# Patient Record
Sex: Female | Born: 1995 | Hispanic: Yes | Smoking: Never smoker
Health system: Southern US, Community
[De-identification: ages and names within clinical notes are randomized; demographics above are authoritative.]

## PROBLEM LIST (undated history)

## (undated) DIAGNOSIS — Z789 Other specified health status: Secondary | ICD-10-CM

## (undated) HISTORY — PX: NO PAST SURGERIES: SHX2092

## (undated) HISTORY — DX: Other specified health status: Z78.9

---

## 2018-05-29 ENCOUNTER — Inpatient Hospital Stay (HOSPITAL_COMMUNITY)
Admission: AD | Admit: 2018-05-29 | Discharge: 2018-05-29 | Disposition: A | Discharge status: Home / Self Care | Payer: Medicaid - Out of State | Attending: Obstetrics & Gynecology | Admitting: Obstetrics & Gynecology

## 2018-05-29 ENCOUNTER — Other Ambulatory Visit: Payer: Self-pay

## 2018-05-29 ENCOUNTER — Encounter (HOSPITAL_COMMUNITY): Payer: Self-pay

## 2018-05-29 DIAGNOSIS — O26892 Other specified pregnancy related conditions, second trimester: Secondary | ICD-10-CM | POA: Diagnosis not present

## 2018-05-29 DIAGNOSIS — Z3A24 24 weeks gestation of pregnancy: Secondary | ICD-10-CM | POA: Insufficient documentation

## 2018-05-29 DIAGNOSIS — R1031 Right lower quadrant pain: Secondary | ICD-10-CM | POA: Insufficient documentation

## 2018-05-29 DIAGNOSIS — O26899 Other specified pregnancy related conditions, unspecified trimester: Secondary | ICD-10-CM

## 2018-05-29 DIAGNOSIS — R102 Pelvic and perineal pain: Secondary | ICD-10-CM

## 2018-05-29 LAB — URINALYSIS, ROUTINE W REFLEX MICROSCOPIC
Bilirubin Urine: NEGATIVE
Glucose, UA: NEGATIVE mg/dL
Hgb urine dipstick: NEGATIVE
Ketones, ur: NEGATIVE mg/dL
Nitrite: NEGATIVE
Protein, ur: NEGATIVE mg/dL
Specific Gravity, Urine: 1.017 (ref 1.005–1.030)
pH: 7 (ref 5.0–8.0)

## 2018-05-29 LAB — POCT PREGNANCY, URINE: Preg Test, Ur: POSITIVE — AB

## 2018-05-29 LAB — WET PREP, GENITAL
Sperm: NONE SEEN
Trich, Wet Prep: NONE SEEN
Yeast Wet Prep HPF POC: NONE SEEN

## 2018-05-29 NOTE — MAU Note (Signed)
Cynthia Smith is a 23 y.o. at [redacted]w[redacted]d here in MAU reporting: intermittent right sided abdominal pain for the past week. No vaginal bleeding, reports normal vaginal discharge, no LOF. + [id number]  Onset of complaint: ongoing for a week  Pain score: 6/10  Vitals:   05/29/18 1558  BP: (!) 105/55  Pulse: 90  Resp: 18  Temp: 99 F (37.2 C)  SpO2: 98%      FHT:+ FM  Lab orders placed from triage: UA, UPT (unaware pt was [redacted] weeks pregnant)

## 2018-05-29 NOTE — Discharge Instructions (Signed)

## 2018-05-29 NOTE — MAU Provider Note (Signed)
History     CSN: [id number]  Arrival date and time: 05/29/18 1530   First Provider Initiated Contact with Patient 05/29/18 1612      Chief Complaint  Patient presents with  . Abdominal Pain   23 y.o. G1 @24 .5 wks presenting with RLQ pain. Pain started 4 days ago. Pain is intermittent and sharp. Pain lasts for short periods of time. Rates 6/10. Pain is relieved by Tylenol. Denies urinary sx. No GI sx. No fevers. No VB, LOF, or ctx. +FM. She was getting care in [place], last visit in late March. Denies heavy lifting but reports recent initiation of doing side planks for exercise.    OB History    Gravida  1   Para      Term      Preterm      AB      Living        SAB      TAB      Ectopic      Multiple      Live Births              History reviewed. No pertinent past medical history.  History reviewed. No pertinent surgical history.  History reviewed. No pertinent family history.  Social History   Tobacco Use  . Smoking status: Never Smoker  . Smokeless tobacco: Never Used  Substance Use Topics  . Alcohol use: Never    Frequency: Never  . Drug use: Never    Allergies: No Known Allergies  No medications prior to admission.    Review of Systems  Constitutional: Negative for fever.  Gastrointestinal: Positive for abdominal pain. Negative for constipation, diarrhea, nausea and vomiting.  Genitourinary: Negative for dysuria, frequency, hematuria, vaginal bleeding and vaginal discharge.  Musculoskeletal: Positive for back pain.   Physical Exam   Blood pressure 113/68, pulse 91, temperature 99 F (37.2 C), temperature source Oral, resp. rate 18, last menstrual period 12/07/2017, SpO2 98 %.  Physical Exam  Nursing note and vitals reviewed. Constitutional: She is oriented to person, place, and time. She appears well-developed and well-nourished. No distress.  HENT:  Head: Normocephalic and atraumatic.  Neck: Normal range of motion.   Cardiovascular: Normal rate.  Respiratory: Effort normal. No respiratory distress.  GI: Soft. She exhibits no distension and no mass. There is no abdominal tenderness. There is no rebound, no guarding and no CVA tenderness.  gravid  Genitourinary:    Genitourinary Comments: SVE closed/thick   Musculoskeletal: Normal range of motion.     Cervical back: Normal.     Thoracic back: Normal.     Lumbar back: Normal.  Neurological: She is alert and oriented to person, place, and time.  Skin: Skin is warm and dry.  Psychiatric: She has a normal mood and affect.  EFM: 135 bpm, mod variability, + accels, no decels Toco: irritability  Results for orders placed or performed during the hospital encounter of 05/29/18 (from the past 24 hour(s))  Urinalysis, Routine w reflex microscopic     Status: Abnormal   Collection Time: 05/29/18  3:48 PM  Result Value Ref Range   Color, Urine YELLOW YELLOW   APPearance CLOUDY (A) CLEAR   Specific Gravity, Urine 1.017 1.005 - 1.030   pH 7.0 5.0 - 8.0   Glucose, UA NEGATIVE NEGATIVE mg/dL   Hgb urine dipstick NEGATIVE NEGATIVE   Bilirubin Urine NEGATIVE NEGATIVE   Ketones, ur NEGATIVE NEGATIVE mg/dL   Protein, ur NEGATIVE NEGATIVE mg/dL  Nitrite NEGATIVE NEGATIVE   Leukocytes,Ua LARGE (A) NEGATIVE   RBC / HPF 0-5 0 - 5 RBC/hpf   WBC, UA 21-50 0 - 5 WBC/hpf   Bacteria, UA RARE (A) NONE SEEN   Squamous Epithelial / LPF 21-50 0 - 5   Mucus PRESENT   Pregnancy, urine POC     Status: Abnormal   Collection Time: 05/29/18  3:52 PM  Result Value Ref Range   Preg Test, Ur POSITIVE (A) NEGATIVE  Wet prep, genital     Status: Abnormal   Collection Time: 05/29/18  4:27 PM  Result Value Ref Range   Yeast Wet Prep HPF POC NONE SEEN NONE SEEN   Trich, Wet Prep NONE SEEN NONE SEEN   Clue Cells Wet Prep HPF POC PRESENT (A) NONE SEEN   WBC, Wet Prep HPF POC MANY (A) NONE SEEN   Sperm NONE SEEN    MAU Course  Procedures Orders Placed This Encounter   Procedures  . Wet prep, genital    Standing Status:   Standing    Number of Occurrences:   1    Order Specific Question:   Patient immune status    Answer:   Normal  . Urinalysis, Routine w reflex microscopic    Standing Status:   Standing    Number of Occurrences:   1  . Pregnancy, urine POC    Standing Status:   Standing    Number of Occurrences:   1  . Discharge patient    Order Specific Question:   Discharge disposition    Answer:   01-Home or Self Care [1]    Order Specific Question:   Discharge patient date    Answer:   05/29/2018   MDM Labs ordered and reviewed. No evidence of PTL or UTI. Pain likely MSK and/or RL. Discussed comfort measures. Stable for discharge home.   Assessment and Plan   1. [redacted] weeks gestation of pregnancy   2. Pain of round ligament during pregnancy    Discharge home Follow up at [facility] to start care asap PTL precautions  Allergies as of 05/29/2018   No Known Allergies     Medication List    You have not been prescribed any medications.    Donette Larry, CNM 05/29/2018, 5:00 PM

## 2018-05-31 LAB — GC/CHLAMYDIA PROBE AMP (~~LOC~~) NOT AT ARMC
Chlamydia: NEGATIVE
Neisseria Gonorrhea: NEGATIVE

## 2018-08-03 ENCOUNTER — Encounter (HOSPITAL_COMMUNITY): Payer: Self-pay

## 2018-08-03 ENCOUNTER — Other Ambulatory Visit: Payer: Self-pay

## 2018-08-03 ENCOUNTER — Inpatient Hospital Stay (HOSPITAL_COMMUNITY)
Admission: AD | Admit: 2018-08-03 | Discharge: 2018-08-03 | Disposition: A | Discharge status: Home / Self Care | Payer: Medicaid - Out of State | Attending: Obstetrics and Gynecology | Admitting: Obstetrics and Gynecology

## 2018-08-03 ENCOUNTER — Inpatient Hospital Stay (HOSPITAL_BASED_OUTPATIENT_CLINIC_OR_DEPARTMENT_OTHER): Payer: Medicaid - Out of State

## 2018-08-03 DIAGNOSIS — B9689 Other specified bacterial agents as the cause of diseases classified elsewhere: Secondary | ICD-10-CM | POA: Insufficient documentation

## 2018-08-03 DIAGNOSIS — O4693 Antepartum hemorrhage, unspecified, third trimester: Secondary | ICD-10-CM | POA: Diagnosis not present

## 2018-08-03 DIAGNOSIS — D649 Anemia, unspecified: Secondary | ICD-10-CM | POA: Insufficient documentation

## 2018-08-03 DIAGNOSIS — O98813 Other maternal infectious and parasitic diseases complicating pregnancy, third trimester: Secondary | ICD-10-CM

## 2018-08-03 DIAGNOSIS — O23593 Infection of other part of genital tract in pregnancy, third trimester: Secondary | ICD-10-CM | POA: Insufficient documentation

## 2018-08-03 DIAGNOSIS — N760 Acute vaginitis: Secondary | ICD-10-CM

## 2018-08-03 DIAGNOSIS — O99013 Anemia complicating pregnancy, third trimester: Secondary | ICD-10-CM

## 2018-08-03 DIAGNOSIS — Z3A34 34 weeks gestation of pregnancy: Secondary | ICD-10-CM | POA: Diagnosis not present

## 2018-08-03 LAB — CBC
HCT: 30.8 % — ABNORMAL LOW (ref 36.0–46.0)
Hemoglobin: 10 g/dL — ABNORMAL LOW (ref 12.0–15.0)
MCH: 29.6 pg (ref 26.0–34.0)
MCHC: 32.5 g/dL (ref 30.0–36.0)
MCV: 91.1 fL (ref 80.0–100.0)
Platelets: 224 10*3/uL (ref 150–400)
RBC: 3.38 MIL/uL — ABNORMAL LOW (ref 3.87–5.11)
RDW: 12.6 % (ref 11.5–15.5)
WBC: 9.5 10*3/uL (ref 4.0–10.5)
nRBC: 0 % (ref 0.0–0.2)

## 2018-08-03 LAB — HEPATITIS B SURFACE ANTIGEN: Hepatitis B Surface Ag: NEGATIVE

## 2018-08-03 LAB — URINALYSIS, ROUTINE W REFLEX MICROSCOPIC
Bilirubin Urine: NEGATIVE
Glucose, UA: NEGATIVE mg/dL
Hgb urine dipstick: NEGATIVE
Ketones, ur: NEGATIVE mg/dL
Nitrite: NEGATIVE
Protein, ur: NEGATIVE mg/dL
Specific Gravity, Urine: 1.014 (ref 1.005–1.030)
pH: 7 (ref 5.0–8.0)

## 2018-08-03 LAB — WET PREP, GENITAL
Sperm: NONE SEEN
Trich, Wet Prep: NONE SEEN
Yeast Wet Prep HPF POC: NONE SEEN

## 2018-08-03 LAB — RAPID URINE DRUG SCREEN, HOSP PERFORMED
Amphetamines: NOT DETECTED
Barbiturates: NOT DETECTED
Benzodiazepines: NOT DETECTED
Cocaine: NOT DETECTED
Opiates: NOT DETECTED
Tetrahydrocannabinol: NOT DETECTED

## 2018-08-03 LAB — ABO/RH: ABO/RH(D): O POS

## 2018-08-03 MED ORDER — METRONIDAZOLE 500 MG PO TABS: 500.0000 mg | ORAL_TABLET | Freq: Two times a day (BID) | ORAL | 0 refills | Status: AC

## 2018-08-03 MED ORDER — FERROUS SULFATE 325 (65 FE) MG PO TABS: 325.0000 mg | ORAL_TABLET | Freq: Every day | ORAL | 1 refills | Status: DC

## 2018-08-03 NOTE — Discharge Instructions (Signed)
Warning Signs During Pregnancy °A pregnancy lasts about 40 weeks, starting from the first day of your last period until the baby is born. Pregnancy is divided into three phases called trimesters. °· The first trimester refers to week 1 through week 13 of pregnancy. °· The second trimester is the start of week 14 through the end of week 27. °· The third trimester is the start of week 28 until you deliver your baby. °During each trimester of pregnancy, certain signs and symptoms may indicate a problem. Talk with your health care provider about your current health and any medical conditions you have. Make sure you know the symptoms that you should watch for and report. °How does this affect me? ° °Warning signs in the first trimester °While some changes during the first trimester may be uncomfortable, most do not represent a serious problem. Let your health care provider know if you have any of the following warning signs in the first trimester: °· You cannot eat or drink without vomiting, and this lasts for longer than a day. °· You have vaginal bleeding or spotting along with menstrual-like cramping. °· You have diarrhea for longer than a day. °· You have a fever or other signs of infection, such as: °? Pain or burning when you urinate. °? Foul smelling or thick or yellowish vaginal discharge. °Warning signs in the second trimester °As your baby grows and changes during the second trimester, there are additional signs and symptoms that may indicate a problem. These include: °· Signs and symptoms of infection, including a fever. °· Signs or symptoms of a miscarriage or preterm labor, such as regular contractions, menstrual-like cramping, or lower abdominal pain. °· Bloody or watery vaginal discharge or obvious vaginal bleeding. °· Feeling like your heart is pounding. °· Having trouble breathing. °· Nausea, vomiting, or diarrhea that lasts for longer than a day. °· Craving non-food items, such as clay, chalk, or dirt.  This may be a sign of a very treatable medical condition called pica. °Later in your second trimester, watch for signs and symptoms of a serious medical condition called preeclampsia.These include: °· Changes in your vision. °· A severe headache that does not go away. °· Nausea and vomiting. °It is also important to notice if your baby stops moving or moves less than usual during this time. °Warning signs in the third trimester °As you approach the third trimester, your baby is growing and your body is preparing for the birth of your baby. In your third trimester, be sure to let your health care provider know if: °· You have signs and symptoms of infection, including a fever. °· You have vaginal bleeding. °· You notice that your baby is moving less than usual or is not moving. °· You have nausea, vomiting, or diarrhea that lasts for longer than a day. °· You have a severe headache that does not go away. °· You have vision changes, including seeing spots or having blurry or double vision. °· You have increased swelling in your hands or face. °How does this affect my baby? °Throughout your pregnancy, always report any of the warning signs of a problem to your health care provider. This can help prevent complications that may affect your baby, including: °· Increased risk for premature birth. °· Infection that may be transmitted to your baby. °· Increased risk for stillbirth. °Contact a health care provider if: °· You have any of the warning signs of a problem for the current trimester of your pregnancy. °·   Any of the following apply to you during any trimester of pregnancy: ? You have strong emotions, such as sadness or anxiety, that interfere with work or personal relationships. ? You feel unsafe in your home and need help finding a safe place to live. ? You are using tobacco products, alcohol, or drugs and you need help to stop. Get help right away if: You have signs or symptoms of labor before 37 weeks of  pregnancy. These include:  Contractions that are 5 minutes or less apart, or that increase in frequency, intensity, or length.  Sudden, sharp abdominal pain or low back pain.  Uncontrolled gush or trickle of fluid from your vagina. Summary  A pregnancy lasts about 40 weeks, starting from the first day of your last period until the baby is born. Pregnancy is divided into three phases called trimesters. Each trimester has warning signs to watch for.  Always report any warning signs to your health care provider in order to prevent complications that may affect both you and your baby.  Talk with your health care provider about your current health and any medical conditions you have. Make sure you know the symptoms that you should watch for and report. This information is not intended to replace advice given to you by your health care provider. Make sure you discuss any questions you have with your health care provider. Document Released: 10/23/2016 Document Revised: 04/27/2018 Document Reviewed: 10/23/2016 Elsevier Patient Education  2020 [place].     Fetal Movement Counts Patient Name: ________________________________________________ Patient Due Date: ____________________ What is a fetal movement count?  A fetal movement count is the number of times that you feel your baby move during a certain amount of time. This may also be called a fetal kick count. A fetal movement count is recommended for every pregnant woman. You may be asked to start counting fetal movements as early as week 28 of your pregnancy. Pay attention to when your baby is most active. You may notice your baby's sleep and wake cycles. You may also notice things that make your baby move more. You should do a fetal movement count:  When your baby is normally most active.  At the same time each day. A good time to count movements is while you are resting, after having something to eat and drink. How do I count fetal  movements? 1. Find a quiet, comfortable area. Sit, or lie down on your side. 2. Write down the date, the start time and stop time, and the number of movements that you felt between those two times. Take this information with you to your health care visits. 3. For 2 hours, count kicks, flutters, swishes, rolls, and jabs. You should feel at least 10 movements during 2 hours. 4. You may stop counting after you have felt 10 movements. 5. If you do not feel 10 movements in 2 hours, have something to eat and drink. Then, keep resting and counting for 1 hour. If you feel at least 4 movements during that hour, you may stop counting. Contact a health care provider if:  You feel fewer than 4 movements in 2 hours.  Your baby is not moving like he or she usually does. Date: ____________ Start time: ____________ Stop time: ____________ Movements: ____________ Date: ____________ Start time: ____________ Stop time: ____________ Movements: ____________ Date: ____________ Start time: ____________ Stop time: ____________ Movements: ____________ Date: ____________ Start time: ____________ Stop time: ____________ Movements: ____________ Date: ____________ Start time: ____________ Stop time: ____________  Start time: ____________ Stop time: ____________ Movements: ____________ Date: ____________ Start time: ____________ Stop time: ____________ Movements: ____________ Date: ____________ Start time: ____________ Stop time: ____________ Movements: ____________ Date: ____________ Start time: ____________ Stop time: ____________ Movements: ____________ This information is not intended to replace advice given to you by your health care provider. Make sure you discuss any questions you have with your health care provider. Document Released: 02/05/2006 Document Revised: 01/26/2018 Document Reviewed: 02/15/2015 Elsevier Patient Education  2020 Elsevier Inc.  

## 2018-08-03 NOTE — MAU Provider Note (Signed)
Chief Complaint:  Vaginal Bleeding   First Provider Initiated Contact with Patient 08/03/18 1527     HPI: Cynthia Smith is a 23 y.o. G1P0 at 2921w1d who presents to maternity admissions reporting vaginal bleeding. Had an episode of dark red blood that stained her clothing 2 days ago. Bleeding did not continue & only occurred once. Has had some intermittent abdominal pains that occur throughout the day but can't tell how often. Last intercourse 2 weeks ago. Denies contractions or LOF. No recent abdominal trauma or fall. Has not had any prenatal care with this pregnancy.   History reviewed. No pertinent past medical history. OB History  Gravida Para Term Preterm AB Living  1            SAB TAB Ectopic Multiple Live Births               # Outcome Date GA Lbr Len/2nd Weight Sex Delivery Anes PTL Lv  1 Current            History reviewed. No pertinent surgical history. History reviewed. No pertinent family history. Social History   Tobacco Use  . Smoking status: Never Smoker  . Smokeless tobacco: Never Used  Substance Use Topics  . Alcohol use: Never    Frequency: Never  . Drug use: Never   No Known Allergies No medications prior to admission.    I have reviewed patient's Past Medical Hx, Surgical Hx, Family Hx, Social Hx, medications and allergies.   ROS:  Review of Systems  Constitutional: Negative.   Gastrointestinal: Negative.   Genitourinary: Positive for vaginal bleeding (has not continued) and vaginal discharge. Negative for dysuria.    Physical Exam   Patient Vitals for the past 24 hrs:  BP Temp Temp src Pulse Resp SpO2  08/03/18 1725 (!) 105/55 - - 91 - -  08/03/18 1511 114/66 98.4 F (36.9 C) Oral (!) 114 16 99 %    Constitutional: Well-developed, well-nourished female in no acute distress.  Cardiovascular: normal rate & rhythm, no murmur Respiratory: normal effort, lung sounds clear throughout GI: Abd soft, non-tender, gravid appropriate for gestational  age. Pos BS x 4 MS: Extremities nontender, no edema, normal ROM Neurologic: Alert and oriented x 4.  GU:      Pelvic: NEFG, moderate amount of frothy gray discharge, no blood, cervix friable.   Cervix visually closed  NST:  Baseline: 140 bpm, Variability: Good {> 6 bpm), Accelerations: Reactive and Decelerations: Absent   Labs: Results for orders placed or performed during the hospital encounter of 08/03/18 (from the past 24 hour(s))  Wet prep, genital     Status: Abnormal   Collection Time: 08/03/18  3:40 PM   Specimen: Cervix  Result Value Ref Range   Yeast Wet Prep HPF POC NONE SEEN NONE SEEN   Trich, Wet Prep NONE SEEN NONE SEEN   Clue Cells Wet Prep HPF POC PRESENT (A) NONE SEEN   WBC, Wet Prep HPF POC MANY (A) NONE SEEN   Sperm NONE SEEN   Urinalysis, Routine w reflex microscopic     Status: Abnormal   Collection Time: 08/03/18  3:59 PM  Result Value Ref Range   Color, Urine YELLOW YELLOW   APPearance CLOUDY (A) CLEAR   Specific Gravity, Urine 1.014 1.005 - 1.030   pH 7.0 5.0 - 8.0   Glucose, UA NEGATIVE NEGATIVE mg/dL   Hgb urine dipstick NEGATIVE NEGATIVE   Bilirubin Urine NEGATIVE NEGATIVE   Ketones, ur NEGATIVE NEGATIVE  mg/dL   Protein, ur NEGATIVE NEGATIVE mg/dL   Nitrite NEGATIVE NEGATIVE   Leukocytes,Ua MODERATE (A) NEGATIVE   RBC / HPF 0-5 0 - 5 RBC/hpf   WBC, UA 6-10 0 - 5 WBC/hpf   Bacteria, UA FEW (A) NONE SEEN   Squamous Epithelial / LPF 0-5 0 - 5   Mucus PRESENT   Rapid urine drug screen (hospital performed)     Status: None   Collection Time: 08/03/18  3:59 PM  Result Value Ref Range   Opiates NONE DETECTED NONE DETECTED   Cocaine NONE DETECTED NONE DETECTED   Benzodiazepines NONE DETECTED NONE DETECTED   Amphetamines NONE DETECTED NONE DETECTED   Tetrahydrocannabinol NONE DETECTED NONE DETECTED   Barbiturates NONE DETECTED NONE DETECTED  CBC     Status: Abnormal   Collection Time: 08/03/18  4:20 PM  Result Value Ref Range   WBC 9.5 4.0 - 10.5  K/uL   RBC 3.38 (L) 3.87 - 5.11 MIL/uL   Hemoglobin 10.0 (L) 12.0 - 15.0 g/dL   HCT 30.8 (L) 36.0 - 46.0 %   MCV 91.1 80.0 - 100.0 fL   MCH 29.6 26.0 - 34.0 pg   MCHC 32.5 30.0 - 36.0 g/dL   RDW 12.6 11.5 - 15.5 %   Platelets 224 150 - 400 K/uL   nRBC 0.0 0.0 - 0.2 %  ABO/Rh     Status: None   Collection Time: 08/03/18  4:20 PM  Result Value Ref Range   ABO/RH(D) O POS    No rh immune globuloin      NOT A RH IMMUNE GLOBULIN CANDIDATE, PT RH POSITIVE Performed at [facility], [address] [place], [place] [address]     Imaging:  No results found.  MAU Course: Orders Placed This Encounter  Procedures  . Wet prep, genital  . Culture, OB Urine  . [place] MFM OB LIMITED  . Urinalysis, Routine w reflex microscopic  . Rapid urine drug screen (hospital performed)  . CBC  . RPR  . HIV Antibody (routine testing w rflx)  . Rubella screen  . Hepatitis B surface antigen  . ABO/Rh  . Discharge patient   Meds ordered this encounter  Medications  . ferrous sulfate (FERROUSUL) 325 (65 FE) MG tablet    Sig: Take 1 tablet (325 mg total) by mouth daily with breakfast.    Dispense:  30 tablet    Refill:  1    Order Specific Question:   Supervising Provider    Answer:   Aletha Halim [[id number]]  . metroNIDAZOLE (FLAGYL) 500 MG tablet    Sig: Take 1 tablet (500 mg total) by mouth 2 (two) times daily for 7 days.    Dispense:  14 tablet    Refill:  0    Order Specific Question:   Supervising Provider    Answer:   Aletha Halim [[id number]]    MDM: Category 1 tracing. No contractions. Cervix visually closed.  No blood on exam but cervix is friable.  GC/cT & wet prep collected. Wet prep + clue cells. Will tx for BV  RH positive  OB labs collected including urine culture UDS negative  Ultrasound shows posterior placenta without signs of abruption. Pt reports episode of bleeding x 1 a few days ago. Bleeding did not continued. No blood noted on exam & cervix is friable which  is likely the cause of the bleeding she saw. Will discharge home. Encouraged patient to f/u for prenatal care.  Assessment: 1. BV (bacterial vaginosis)   2. [redacted] weeks gestation of pregnancy   3. Vaginal bleeding in pregnancy, third trimester   4. Anemia affecting pregnancy in third trimester     Plan: Discharge home in stable condition.  Discussed reasons to return to MAU Msg sent to [facility] for prenatal care OB labs, urine culture, & GC pending Rx ferrous sulfate & flagyl  Follow-up Information    [facility].   Specialty: Obstetrics and Gynecology Why: the office will call you to schedule your prenatal care Contact information: [address], [address] [id number] [place] [phone]       [facility] Follow up.   Specialty: Obstetrics and Gynecology Why: return for worsening symptoms Contact information: [address] [id number] [place] [phone]          Allergies as of 08/03/2018   No Known Allergies     Medication List    TAKE these medications   ferrous sulfate 325 (65 FE) MG tablet Commonly known as: FerrouSul Take 1 tablet (325 mg total) by mouth daily with breakfast.   metroNIDAZOLE 500 MG tablet Commonly known as: FLAGYL Take 1 tablet (500 mg total) by mouth 2 (two) times daily for 7 days.       Judeth HornLawrence, Danija Gosa, NP 08/03/2018 5:31 PM

## 2018-08-03 NOTE — MAU Note (Signed)
Pt states that she had vaginal bleeding 2 days ago.   Reports +FM   Denies vaginal bleeding and LOF.

## 2018-08-04 LAB — HIV ANTIBODY (ROUTINE TESTING W REFLEX): HIV Screen 4th Generation wRfx: NONREACTIVE

## 2018-08-04 LAB — GC/CHLAMYDIA PROBE AMP (~~LOC~~) NOT AT ARMC
Chlamydia: NEGATIVE
Neisseria Gonorrhea: NEGATIVE

## 2018-08-04 LAB — RUBELLA SCREEN: Rubella: <0.9 index — ABNORMAL LOW (ref >0.99)

## 2018-08-04 LAB — RPR: RPR Ser Ql: NONREACTIVE

## 2018-08-05 LAB — CULTURE, OB URINE: Culture: >=100000 — AB

## 2018-08-06 ENCOUNTER — Telehealth: Payer: Self-pay | Admitting: Student

## 2018-08-06 DIAGNOSIS — O2343 Unspecified infection of urinary tract in pregnancy, third trimester: Secondary | ICD-10-CM

## 2018-08-06 MED ORDER — CEPHALEXIN 500 MG PO CAPS: 500.0000 mg | ORAL_CAPSULE | Freq: Four times a day (QID) | ORAL | 0 refills | Status: DC

## 2018-08-06 NOTE — Telephone Encounter (Signed)
Verified by name & DOB. Notified of positive urine culture. NKDA.  Rx sent to pharmacy.   Jorje Guild, NP

## 2018-08-09 ENCOUNTER — Telehealth: Payer: Self-pay | Admitting: Student

## 2018-08-09 NOTE — Telephone Encounter (Signed)
Spoke with patient to get her scheduled to start prenatal care. Patient was scheduled for 7/21 @ 2:55. Patient instructed to wear a face mask for the entire appointment and no visitors are allowed with her during the visit. Patient screened for covid symptoms and denied having any

## 2018-08-10 ENCOUNTER — Encounter: Payer: Medicaid - Out of State | Admitting: Student

## 2018-08-12 ENCOUNTER — Ambulatory Visit (INDEPENDENT_AMBULATORY_CARE_PROVIDER_SITE_OTHER): Payer: Self-pay | Admitting: Obstetrics and Gynecology

## 2018-08-12 ENCOUNTER — Other Ambulatory Visit: Payer: Self-pay

## 2018-08-12 VITALS — BP 117/70 | HR 113 | Wt 148.0 lb

## 2018-08-12 DIAGNOSIS — O0933 Supervision of pregnancy with insufficient antenatal care, third trimester: Secondary | ICD-10-CM | POA: Insufficient documentation

## 2018-08-12 DIAGNOSIS — Z23 Encounter for immunization: Secondary | ICD-10-CM

## 2018-08-12 DIAGNOSIS — Z3A35 35 weeks gestation of pregnancy: Secondary | ICD-10-CM

## 2018-08-12 DIAGNOSIS — O0990 Supervision of high risk pregnancy, unspecified, unspecified trimester: Secondary | ICD-10-CM

## 2018-08-12 NOTE — Patient Instructions (Signed)
AREA PEDIATRIC/FAMILY PRACTICE PHYSICIANS  [place] ([address]) . [facility] o Chambliss, MD; Eniola, MD; Hale, MD; Hensel, MD; McDiarmid, MD; McIntyer, MD; Neal, MD; Walden, MD o [address] [place], [place] [address] o [phone] o Mon-Fri 8:30-12:30, 1:30-5:00 o Providers come to see babies at [facility] o Accepting Medicaid . [facility] o Limited providers who accept newborns: Koirala, MD; Morrow, MD; Wolters, MD o [address], [place], [place] [address] o [phone] o Mon-Fri 8:00-5:30 o Babies seen by providers at [facility] o Does NOT accept Medicaid o Please call early in hospitalization for appointment (limited availability)  . [facility] o Mulberry, MD o [address] [place], [place] [address] o [phone] o Mon, Tue, Thur, Fri 8:30-5:00, Wed 10:00-7:00 (closed 1-2pm) o Babies seen by [facility] providers o Accepting Medicaid . [facility] - Pediatrician o Rubin, MD o [address], [place], [place] [address] o [phone] o Mon-Fri 8:30-5:00, Sat 8:30-12:00 o Provider comes to see babies at [facility] o Accepting Medicaid o Must have been referred from current patients or contacted office prior to delivery . [facility] ([facility]) o Brown, MD; Chandler, MD; Ettefagh, MD; Grant, MD; Lester, MD; McCormick, MD; McQueen, MD; Prose, MD; Simha, MD; Stanley, MD; Stryffeler, NP; Tebben, NP o [address], [place], [place] [address] o [phone] o Mon, Tue, Thur, Fri 8:30-5:30, Wed 9:30-5:30, Sat 8:30-12:30 o Babies seen by [facility] providers o Accepting Medicaid o Only accepting infants of first-time parents or siblings of current patients o Hospital discharge coordinator will make follow-up appointment . Jack Amos o [address],  [place], [place]  [address] o [phone]   Fax - [phone] . [facility] o [address], [place], [place]  [address] o Phone - [phone]   Fax - [phone] . Shilpa Gosrani o [address], [place], [place]  [address] o [phone]  [place] ([address]) . [facility] o Bates, MD; [place]ass[place], MD; Cooper, Cox, MD; MD; Davis, MD; Dovico, MD; Ettefaugh, MD; Little, MD; Lowe, MD; Keiffer, MD; Melvin, MD; Sumner, MD; Williams, MD o [address], [place], [place] [address] o [phone] o Mon-Fri 8:30-5:00 (extended evenings Mon-Thur as needed), Sat-Sun 10:00-1:00 o Providers come to see babies at [facility] o Accepting Medicaid for families of first-time babies and families with all children in the household age 3 and under. Must register with office prior to making appointment (M-F only). . [facility] o Henson, NP; Knapp, MD; Lalonde, MD; Tysinger, PA o [address] [place], [place] [address] o [phone] o Mon-Fri 8:00-5:00 o Babies seen by providers at [facility] o Does NOT accept Medicaid/Commercial Insurance Only . [facility] ([facility]  o Artis, MD; Barnes, MD; Bratton, MD; Coccaro, MD; Lockett Gardner, MD; Kramer, MD; Marshall, MD; Netherton, MD; Poleto, MD; Skinner, MD o [address] [place], [place] [address] o [phone] o Mon-Fri 8:30-5:30, Sat (Oct.-Mar.) 9:00-1:00 o Babies seen by providers at [facility] o Accepting Medicaid  [place] ([address]) . [facility] o Reid, MD; Warner, MD o [address], [place],  [address] o [phone] o Mon-Fri 8:30-5:00, Sat 8:30-12:00 o Providers come to see babies at [facility] o Does NOT accept Medicaid . [facility] o Becker, PA; Hagler, MD; Scifres, PA; Sun, MD; Swayne, MD o [address],  [place], [place] [address] o [phone] o Mon-Fri 8:00-5:00 o Babies seen by providers at [facility] o Does NOT accept Medicaid o Only accepting babies of parents who are patients o Please call early in hospitalization for appointment (limited availability) . [facility] o Clark, MD; Frye, MD; Kelleher, MD; Mack, NP; Miller, MD; O'Keller, MD; Patterson, NP; Pudlo, MD; Puzio, MD; Thomas, MD; Tucker, MD; Twiselton, MD o [address], [place], [place] [address] o [phone] o Mon-Fri 8:00-5:00, Sat 9:00-12:00 o Providers come to see babies at [facility] o Does NOT accept Medicaid  [place] ([address]) . [facility] o Limited providers accepting new patients: Brake, NP; Wharton, PA o [address], [place], [place] [address] o [phone] o Mon-Fri 8:00-5:00 o Babies seen by providers at [facility] o Does NOT accept Medicaid o Only accepting babies of parents who are patients o Please call early in hospitalization for appointment (limited availability) . [facility] o Gay, MD; Quinlan, MD o [address] [place], [place] [address] o [phone] (press 1 to schedule appointment) o Mon-Fri 8:00-5:00 o Providers come to see babies at [facility] o Does NOT accept Medicaid . [facility] o Mazer, MD o [address] [place], [place] [address] o [phone] o Mon-Fri 8:30-5:00 (lunch 12:30-1:00), extended hours by appointment only Wed 5:00-6:30 o Babies seen by [facility] providers o Accepting Medicaid . [facility] o Banks, MD; Jordan, MD; Koberlein, MD o [address], [place], [place] [address] o [phone] o Mon-Fri 8:00-5:00 o Babies seen by [facility] providers o Does NOT accept Medicaid . [facility] o Parker, MD; Hunter, MD; Wallace, DO o [address] [place], [place]  [address] o [phone] o Mon-Fri 8:00-5:00 o Babies seen by [facility] providers o Does NOT accept Medicaid . [facility] o Brandon, PA; Brecken, PA; Christy, NP; Dees, MD; DeClaire, MD; DeWeese, MD; Hansen, NP; Mills, NP; Parrish, NP; Smoot, NP; Summer, MD; Vapne, MD o [address] [place], [place] [address] o [phone] o Mon-Fri 8:30-5:00, Sat 10:00-1:00 o Providers come to see babies at [facility] o Does NOT accept Medicaid o Free prenatal information session Tuesdays at 4:45pm . [facility] o Bouska, MD; Gordon, PA; Jeffery, PA; Weber, PA o [address] [place] [address] o [phone] o Mon-Fri 7:30-5:30 o Babies seen by [facility] providers . [facility] o [address], [place], [place], [place]  [address] o [phone]   Fax - [phone]  [place] ([phone]) . [facility] o [place]ese, [place] o [address] [place], [place] [address] o [phone] o Mon-Thur 8:00-6:00 o Providers come to see babies at [facility] o Accepting Medicaid . [facility] o Anderson, NP; Badger, MD; Beal, PA; Spencer, PA o [address] [place],  [address] o [phone] o Mon-Thur 7:30-7:30, Fri 7:30-4:30 o Babies seen by [facility] providers o Accepting Medicaid . [facility] o Agbuya, MD; Klett, NP; Romgoolam, MD o [address], ,  [address] o [phone] o Mon-Fri 8:30-5:00, Sat 8:30-12:00 o Providers come to see babies at [facility] o Accepting Medicaid o Must have "Meet & Greet" appointment at office prior to delivery . [facility]  ([facility] ) o McCord,   MD; Juleen China, MD; Clydene Laming, [address], [place], [place] [address] o [phone] o Mon-Wed 8:00-6:00, Thur-Fri 8:00-5:00, Sat 9:00-12:00 o Providers come to  see babies at [facility] o Does NOT accept Medicaid o Only accepting siblings of current patients . [facility] [place], [place], [place], [place]  [address] o [phone]   Fax [phone] . [facility] [address], [place], [place]  [address] o [phone]   Fax - Morton Gorman [phone] & [phone]) . Therapist, music at [place], DO; [place], [address] [place], [place] [address] o [phone] o Mon-Fri 7:00-5:00 o Babies seen by [facility] providers o Does NOT accept Medicaid . [place], MD; [place], [place]; [place], [place], [place], [place] [address] o [phone] o Mon-Fri 8:00-5:00 o Babies seen by [facility] providers o Accepting Medicaid . Lamont, MD; [place], [place]; [place], NP; [place], [place], [place], [place] [address] o [phone] o Mon-Fri 8:00-5:00 o Babies seen by providers at [facility] [place] [phone]) . [facility] [place], [place] o [address] [place], [place] [address] o [phone] o Mon-Fri 8:00-5:00 o Babies seen by [facility] providers o Does NOT accept Medicaid o Limited availability, please call early in hospitalization to schedule follow-up . [facility] Leilani Merl, PA; Maisie Fus, MD; [place], MD; [place], [place]; Jeannine Kitten, MD; [place], [place] [facility] [address], [place], [place] [address] o [phone] o Mon-Fri 8:30-5:00, Sat 9:00-12:00 o Babies seen by providers at [facility] o Accepting Medicaid o Please register online then schedule online or call office o [email or web address] . [place] [facility]  [place]) Kristian Covey, NP; Dwyane Dee, MD; Leonidas Romberg, PA o [address] [place], [place], [place] [address] o [phone] o Mon-Fri 8:00-5:00 o Babies seen by providers at [facility] o Accepting Medicaid . [place] ([facility]) Dairl Ponder, MD; Rayvon Char, NP; Melina Modena, MD o [address] [place], [place], [place] [address] o [phone] o Mon-Fri 8:00-5:30, Sat&Sun by appointment (phones open at 8:30) o Babies seen by [facility] providers o Accepting Medicaid o Must be a first-time baby or sibling of current patient . [place], [address], [place], [place]  [address] o [phone]   Fax - [phone]  [place] [phone] & [phone]) . [place], [place]; [place], [place]; Benjamine Mola, MD; [place], [place]; Harrell Lark, MD o [address] [place], [place] [address] o [phone] o Mon-Thur 8:00-7:00, Fri 8:00-5:00, Sat 8:00-12:00, Sun 9:00-12:00 o Babies seen by [facility] providers o Accepting Medicaid . [facility], MD; Ruthann Cancer, MD; [facility], MD o [address], [place], [place] [address] o [phone] o Mon-Thur 8:00-5:00 o Babies seen by providers at [facility] o Accepting Medicaid . [facility] [place], MD; Coe-Goins, MD; Amedeo Plenty, MD; Bobby Rumpf, MD; List, MD; Lavonia Drafts, MD; Ruthann Cancer, MD; Selinda Eon, MD; Audie Box, MD; Jim Like, MD; Christie Nottingham, MD; Hubbard Hartshorn, MD; Modena Nunnery, MD o [address] [place], [place]  [address] o [phone] o Mon-Fri 8:00-5:30, Sat (Oct.-Mar.) 9:00-1:00 o Babies seen by providers at [facility] o Accepting Medicaid o Must fill out new patient packet, available online at [email or web address] . [facility] ([facility]) o Friddle, NP; Harris, NP; Kelly, NP; Logan, MD;  Melvin, PA; Poth, MD; Ramadoss, MD; Stanton, NP o [address], [place], [place] [address] o [phone] o Mon-Thur 8:00-5:30, Fri 8:00-5:00 o Babies seen by providers at [facility] o Accepting Medicaid  [place] ([address]) . [facility] o Dixon, PA; Dawson, MD; Pickard, MD; Tapia, PA o [address], [place], [place] [address] o [phone] o Mon-Fri 8:00-5:00 o Babies seen by providers at [facility] o Accepting Medicaid   [place] ([address]) . [facility] [place] o Masneri, DO; Meyers, MD; Nelson, PA o [address], [place], [place] [address] o [phone] o Mon-Fri 8:00-5:00 o Babies seen by providers at [facility] o Does NOT accept Medicaid o Limited appointment availability, please call early in hospitalization  . [facility] [place] o Kunedd, DO; McGowen, MD o [address], [place], [place] [address] o [phone] o Mon-Fri 8:00-5:00 o Babies seen by [facility] providers o Does NOT accept Medicaid . [facility] [place] o Cameron, MD; MacDonald, MD; Michaels, PA; Nayak, MD o [address] [place] [address], [place], [place] [address] o [phone] o Mon-Fri 8:00-5:00 o After hours clinic ([address] [place], [place] [address]) [phone] Mon-Fri 5:00-8:00, Sat 12:00-6:00, Sun 10:00-4:00 o Babies seen by [facility] providers o Accepting Medicaid . [facility] [place] o [address], [place], [place]  [address] o [phone]   Fax - [phone]  [place] ([address]) . [facility] [place] [facility] o Andy, MD o [address], [place], [place] [address] o [phone] o Mon-Fri 8:00-5:00 o Babies seen by [facility] providers o Does NOT accept Medicaid . [facility] [place] ([facility] [place]) o Eksir, MD o [address], [place], [place]  [address] o [phone] o Mon-Thur 8:00-7:00, Fri 8:00-5:00, Sat 8:00-12:00 o Babies seen by providers at [facility] o Accepting Medicaid - but does not have vaccinations in office (must be received elsewhere) o Limited availability, please call early in hospitalization  [place] ([address]) . [facility]  o Charlene Flemming, MD o [address], [place] [address] o [phone]  Fax [phone] Places to have your son circumcised:                                                                      [facility]     [phone]   $480 while you are in hospital         [facility]              [phone]   $269 by 4 wks                      [facility]                     [phone]   $  269 by 7 days [facility]                    [phone]   $269 by 4 wks [facility]             [phone]   $225 by 2 wks    These prices sometimes change but are roughly what you can expect to pay. Please call and confirm pricing.   Circumcision is considered an elective/non-medically necessary procedure. There are many reasons parents decide to have their sons circumsized. During the first year of life circumcised males have a reduced risk of urinary tract infections but after this year the rates between circumcised males and uncircumcised males are the same.  It is safe to have your son circumcised outside of the hospital and the places above perform them regularly.   Deciding about Circumcision in Baby Boys  (Up-to-date The Basics)  What is circumcision?   Circumcision is a surgery that removes the skin that covers the tip of the penis, called the "foreskin" Circumcision is usually done when a boy is between 1 and 10 days old. In the [place], circumcision is common. In some other countries, fewer boys are circumcised. Circumcision is a common tradition in some religions.  Should I have my baby boy circumcised?   There is no easy answer. Circumcision has some benefits. But it also has risks.  After talking with your doctor, you will have to decide for yourself what is right for your family.  What are the benefits of circumcision?   Circumcised boys seem to have slightly lower rates of: ?Urinary tract infections ?Swelling of the opening at the tip of the penis Circumcised men seem to have slightly lower rates of: ?Urinary tract infections ?Swelling of the opening at the tip of the penis ?Penis cancer ?HIV and other infections that you catch during sex ?Cervical cancer in the women they have sex with Even so, in the [place], the risks of these problems are small - even in boys and men who have not been circumcised. Plus, boys and men who are not circumcised can reduce these extra risks by: ?Cleaning their penis well ?Using condoms during sex  What are the risks of circumcision?  Risks include: ?Bleeding or infection from the surgery ?Damage to or amputation of the penis ?A chance that the doctor will cut off too much or not enough of the foreskin ?A chance that sex won't feel as good later in life Only about 1 out of every 200 circumcisions leads to problems. There is also a chance that your health insurance won't pay for circumcision.  How is circumcision done in baby boys?  First, the baby gets medicine for pain relief. This might be a cream on the skin or a shot into the base of the penis. Next, the doctor cleans the baby's penis well. Then he or she uses special tools to cut off the foreskin. Finally, the doctor wraps a bandage (called gauze) around the baby's penis. If you have your baby circumcised, his doctor or nurse will give you instructions on how to care for him after the surgery. It is important that you follow those instructions carefully.  

## 2018-08-12 NOTE — Progress Notes (Signed)
History:   Cynthia Smith is a 23 y.o. G1P0 at [redacted]w[redacted]d by LMP being seen today for her first obstetrical visit.  Her obstetrical history is significant for limited prenatal care.  Patient does intend to breast feed. Pregnancy history fully reviewed.  Says she had 3 visits in [place] prior to moving to [place] at [employer], [place].    Patient reports no complaints.  HISTORY: OB History  Gravida Para Term Preterm AB Living  1 0 0 0 0 0  SAB TAB Ectopic Multiple Live Births  0 0 0 0 0    # Outcome Date GA Lbr Len/2nd Weight Sex Delivery Anes PTL Lv  1 Current             Last pap smear was done 2020 and was Patient does not know- records requested.   No past medical history on file. No past surgical history on file. No family history on file. Social History   Tobacco Use  . Smoking status: Never Smoker  . Smokeless tobacco: Never Used  Substance Use Topics  . Alcohol use: Never    Frequency: Never  . Drug use: Never   No Known Allergies Current Outpatient Medications on File Prior to Visit  Medication Sig Dispense Refill  . ferrous sulfate (FERROUSUL) 325 (65 FE) MG tablet Take 1 tablet (325 mg total) by mouth daily with breakfast. 30 tablet 1  . cephALEXin (KEFLEX) 500 MG capsule Take 1 capsule (500 mg total) by mouth 4 (four) times daily. (Patient not taking: Reported on 08/12/2018) 28 capsule 0   No current facility-administered medications on file prior to visit.     Review of Systems Pertinent items noted in HPI and remainder of comprehensive ROS otherwise negative. Physical Exam:   Vitals:   08/12/18 1510  BP: 117/70  Pulse: (!) 113  Weight: 148 lb (67.1 kg)   Fetal Heart Rate (bpm): 157 System: General: well-developed, well-nourished female in no acute distress   Skin: normal coloration and turgor, no rashes   Neurologic: oriented, normal, negative, normal mood   Extremities: normal strength, tone, and muscle mass, ROM  of all joints is normal   HEENT PERRLA, extraocular movement intact and sclera clear, anicteric   Mouth/Teeth mucous membranes moist, pharynx normal without lesions and dental hygiene good   Neck supple and no masses   Cardiovascular: regular rate and rhythm   Respiratory:  no respiratory distress, normal breath sounds   Abdomen: soft, non-tender; bowel sounds normal; no masses,  no organomegaly   Assessment:    Pregnancy: G1P0 Patient Active Problem List   Diagnosis Date Noted  . Supervision of high risk pregnancy, antepartum 08/12/2018     Plan:   1. Supervision of high risk pregnancy, antepartum  - [place] MFM OB DETAIL +14 WK; Future - Obstetric Panel, Including HIV - Culture, OB Urine - HgB A1c  2. Limited prenatal care in third trimester  - HgB A1c - Patient to return tomorrow AM for 2 hour GTT - Records requested from previous OB office.  - 1 hour GTT today.   Initial labs drawn. Continue prenatal vitamins. Genetic Screening discussed, Late to care: Ultrasound discussed; fetal anatomic survey: ordered. Problem list reviewed and updated. The nature of [place] with multiple MDs and other Advanced Practice Providers was explained to patient; also emphasized that residents, students are part of our team. Routine obstetric precautions reviewed. Return in about 1 week (around 08/19/2018)  for For in person visit. Needs GBS and cultures. Duane Lope.      Sheryl Saintil I, NP [facility] for [facility], [facility]

## 2018-08-14 ENCOUNTER — Encounter: Payer: Self-pay | Admitting: Student

## 2018-08-14 DIAGNOSIS — Z283 Underimmunization status: Secondary | ICD-10-CM | POA: Insufficient documentation

## 2018-08-14 DIAGNOSIS — O09899 Supervision of other high risk pregnancies, unspecified trimester: Secondary | ICD-10-CM | POA: Insufficient documentation

## 2018-08-14 LAB — HEMOGLOBINOPATHY EVALUATION
HGB C: 0 %
HGB S: 0 %
HGB VARIANT: 0 %
Hemoglobin A2 Quantitation: 2 % (ref 1.8–3.2)
Hemoglobin F Quantitation: 0 % (ref 0.0–2.0)
Hgb A: 98 % (ref 96.4–98.8)

## 2018-08-14 LAB — OBSTETRIC PANEL, INCLUDING HIV
Antibody Screen: NEGATIVE
Basophils Absolute: 0 10*3/uL (ref 0.0–0.2)
Basos: 0 %
EOS (ABSOLUTE): 0.1 10*3/uL (ref 0.0–0.4)
Eos: 1 %
HIV Screen 4th Generation wRfx: NONREACTIVE
Hematocrit: 30.3 % — ABNORMAL LOW (ref 34.0–46.6)
Hemoglobin: 10.4 g/dL — ABNORMAL LOW (ref 11.1–15.9)
Hepatitis B Surface Ag: NEGATIVE
Immature Grans (Abs): 0.2 10*3/uL — ABNORMAL HIGH (ref 0.0–0.1)
Immature Granulocytes: 2 %
Lymphocytes Absolute: 1.7 10*3/uL (ref 0.7–3.1)
Lymphs: 20 %
MCH: 29.6 pg (ref 26.6–33.0)
MCHC: 34.3 g/dL (ref 31.5–35.7)
MCV: 86 fL (ref 79–97)
Monocytes Absolute: 1 10*3/uL — ABNORMAL HIGH (ref 0.1–0.9)
Monocytes: 12 %
Neutrophils Absolute: 5.5 10*3/uL (ref 1.4–7.0)
Neutrophils: 65 %
Platelets: 234 10*3/uL (ref 150–450)
RBC: 3.51 x10E6/uL — ABNORMAL LOW (ref 3.77–5.28)
RDW: 13.1 % (ref 11.7–15.4)
RPR Ser Ql: NONREACTIVE
Rh Factor: POSITIVE
Rubella Antibodies, IGG: <0.9 index — ABNORMAL LOW (ref >0.99)
WBC: 8.6 10*3/uL (ref 3.4–10.8)

## 2018-08-14 LAB — CULTURE, OB URINE

## 2018-08-14 LAB — HEMOGLOBIN A1C
Est. average glucose Bld gHb Est-mCnc: 103 mg/dL
Hgb A1c MFr Bld: 5.2 % (ref 4.8–5.6)

## 2018-08-14 LAB — GLUCOSE TOLERANCE, 1 HOUR: Glucose, 1Hr PP: 123 mg/dL (ref 65–199)

## 2018-08-14 LAB — URINE CULTURE, OB REFLEX

## 2018-08-19 ENCOUNTER — Ambulatory Visit (INDEPENDENT_AMBULATORY_CARE_PROVIDER_SITE_OTHER): Payer: Self-pay | Admitting: Obstetrics and Gynecology

## 2018-08-19 ENCOUNTER — Other Ambulatory Visit: Payer: Self-pay

## 2018-08-19 VITALS — BP 104/63 | HR 88 | Temp 98.1°F | Wt 150.1 lb

## 2018-08-19 DIAGNOSIS — O0993 Supervision of high risk pregnancy, unspecified, third trimester: Secondary | ICD-10-CM

## 2018-08-19 DIAGNOSIS — Z3A36 36 weeks gestation of pregnancy: Secondary | ICD-10-CM

## 2018-08-19 DIAGNOSIS — O0990 Supervision of high risk pregnancy, unspecified, unspecified trimester: Secondary | ICD-10-CM

## 2018-08-19 DIAGNOSIS — Z113 Encounter for screening for infections with a predominantly sexual mode of transmission: Secondary | ICD-10-CM

## 2018-08-19 NOTE — Progress Notes (Signed)
   PRENATAL VISIT NOTE  Subjective:  Cynthia Smith is a 23 y.o. G1P0 at [redacted]w[redacted]d being seen today for ongoing prenatal care.  She is currently monitored for the following issues for this high-risk pregnancy and has Limited prenatal care in third trimester and Rubella non-immune status, antepartum on their problem list.  Patient reports no complaints.  Contractions: Not present. Vag. Bleeding: None.  Movement: Present. Denies leaking of fluid.   The following portions of the patient's history were reviewed and updated as appropriate: allergies, current medications, past family history, past medical history, past social history, past surgical history and problem list.   Objective:   Vitals:   08/19/18 1619  BP: 104/63  Pulse: 88  Temp: 98.1 F (36.7 C)  Weight: 150 lb 1.6 oz (68.1 kg)    Fetal Status: Fetal Heart Rate (bpm): 138 Fundal Height: 36 cm Movement: Present     General:  Alert, oriented and cooperative. Patient is in no acute distress.  Skin: Skin is warm and dry. No rash noted.   Cardiovascular: Normal heart rate noted  Respiratory: Normal respiratory effort, no problems with respiration noted  Abdomen: Soft, gravid, appropriate for gestational age.  Pain/Pressure: Present     Pelvic: Cervical exam deferred        Extremities: Normal range of motion.  Edema: Trace  Mental Status: Normal mood and affect. Normal behavior. Normal judgment and thought content.   Assessment and Plan:  Pregnancy: G1P0 at [redacted]w[redacted]d 1. Supervision of high risk pregnancy, antepartum  - GC/Chlamydia probe amp ([place])not at [facility] - Culture, beta strep (group b only)  Preterm labor symptoms and general obstetric precautions including but not limited to vaginal bleeding, contractions, leaking of fluid and fetal movement were reviewed in detail with the patient. Please refer to After Visit Summary for other counseling recommendations.   Return in about 1 week (around 08/26/2018), or In- office  visit.  Future Appointments  Date Time Provider [place]  08/20/2018  3:00 PM [place] NURSE [place] [facility]  08/20/2018  3:00 PM [place] 3 [facility]    Noni Saupe, NP

## 2018-08-19 NOTE — Progress Notes (Signed)
Pt was declined Medicaid, will give a BP C uff to her.

## 2018-08-20 ENCOUNTER — Ambulatory Visit (HOSPITAL_COMMUNITY)
Admission: RE | Admit: 2018-08-20 | Discharge: 2018-08-20 | Disposition: A | Discharge status: Home / Self Care | Payer: Self-pay | Source: Ambulatory Visit | Attending: Obstetrics and Gynecology | Admitting: Obstetrics and Gynecology

## 2018-08-20 ENCOUNTER — Ambulatory Visit (HOSPITAL_COMMUNITY): Payer: Self-pay | Admitting: *Deleted

## 2018-08-20 ENCOUNTER — Encounter (HOSPITAL_COMMUNITY): Payer: Self-pay

## 2018-08-20 DIAGNOSIS — Z283 Underimmunization status: Secondary | ICD-10-CM

## 2018-08-20 DIAGNOSIS — Z3A36 36 weeks gestation of pregnancy: Secondary | ICD-10-CM

## 2018-08-20 DIAGNOSIS — O4693 Antepartum hemorrhage, unspecified, third trimester: Secondary | ICD-10-CM

## 2018-08-20 DIAGNOSIS — O0990 Supervision of high risk pregnancy, unspecified, unspecified trimester: Secondary | ICD-10-CM | POA: Insufficient documentation

## 2018-08-20 DIAGNOSIS — O9989 Other specified diseases and conditions complicating pregnancy, childbirth and the puerperium: Secondary | ICD-10-CM | POA: Insufficient documentation

## 2018-08-20 DIAGNOSIS — O09899 Supervision of other high risk pregnancies, unspecified trimester: Secondary | ICD-10-CM

## 2018-08-20 DIAGNOSIS — O0933 Supervision of pregnancy with insufficient antenatal care, third trimester: Secondary | ICD-10-CM

## 2018-08-21 ENCOUNTER — Encounter: Payer: Self-pay | Admitting: Obstetrics and Gynecology

## 2018-08-21 LAB — GC/CHLAMYDIA PROBE AMP (~~LOC~~) NOT AT ARMC
Chlamydia: NEGATIVE
Neisseria Gonorrhea: NEGATIVE

## 2018-08-22 LAB — CULTURE, BETA STREP (GROUP B ONLY): Strep Gp B Culture: NEGATIVE

## 2018-08-26 ENCOUNTER — Telehealth: Payer: Self-pay | Admitting: Obstetrics & Gynecology

## 2018-08-26 NOTE — Telephone Encounter (Signed)
unable to reach, line disconnected

## 2018-08-27 ENCOUNTER — Ambulatory Visit (INDEPENDENT_AMBULATORY_CARE_PROVIDER_SITE_OTHER): Payer: Self-pay | Admitting: Medical

## 2018-08-27 ENCOUNTER — Encounter: Payer: Self-pay | Admitting: Medical

## 2018-08-27 ENCOUNTER — Other Ambulatory Visit: Payer: Self-pay

## 2018-08-27 ENCOUNTER — Telehealth: Payer: Self-pay | Admitting: Medical

## 2018-08-27 VITALS — BP 108/67 | HR 97 | Wt 158.0 lb

## 2018-08-27 DIAGNOSIS — O0993 Supervision of high risk pregnancy, unspecified, third trimester: Secondary | ICD-10-CM

## 2018-08-27 DIAGNOSIS — O9989 Other specified diseases and conditions complicating pregnancy, childbirth and the puerperium: Secondary | ICD-10-CM

## 2018-08-27 DIAGNOSIS — Z283 Underimmunization status: Secondary | ICD-10-CM

## 2018-08-27 DIAGNOSIS — O09899 Supervision of other high risk pregnancies, unspecified trimester: Secondary | ICD-10-CM

## 2018-08-27 DIAGNOSIS — O0990 Supervision of high risk pregnancy, unspecified, unspecified trimester: Secondary | ICD-10-CM

## 2018-08-27 DIAGNOSIS — Z3A37 37 weeks gestation of pregnancy: Secondary | ICD-10-CM

## 2018-08-27 NOTE — Patient Instructions (Addendum)
Fetal Movement Counts Patient Name: ________________________________________________ Patient Due Date: ____________________ What is a fetal movement count?  A fetal movement count is the number of times that you feel your baby move during a certain amount of time. This may also be called a fetal kick count. A fetal movement count is recommended for every pregnant woman. You may be asked to start counting fetal movements as early as week 28 of your pregnancy. Pay attention to when your baby is most active. You may notice your baby's sleep and wake cycles. You may also notice things that make your baby move more. You should do a fetal movement count:  When your baby is normally most active.  At the same time each day. A good time to count movements is while you are resting, after having something to eat and drink. How do I count fetal movements? 1. Find a quiet, comfortable area. Sit, or lie down on your side. 2. Write down the date, the start time and stop time, and the number of movements that you felt between those two times. Take this information with you to your health care visits. 3. For 2 hours, count kicks, flutters, swishes, rolls, and jabs. You should feel at least 10 movements during 2 hours. 4. You may stop counting after you have felt 10 movements. 5. If you do not feel 10 movements in 2 hours, have something to eat and drink. Then, keep resting and counting for 1 hour. If you feel at least 4 movements during that hour, you may stop counting. Contact a health care provider if:  You feel fewer than 4 movements in 2 hours.  Your baby is not moving like he or she usually does. Date: ____________ Start time: ____________ Stop time: ____________ Movements: ____________ Date: ____________ Start time: ____________ Stop time: ____________ Movements: ____________ Date: ____________ Start time: ____________ Stop time: ____________ Movements: ____________ Date: ____________ Start time:  ____________ Stop time: ____________ Movements: ____________ Date: ____________ Start time: ____________ Stop time: ____________ Movements: ____________ Date: ____________ Start time: ____________ Stop time: ____________ Movements: ____________ Date: ____________ Start time: ____________ Stop time: ____________ Movements: ____________ Date: ____________ Start time: ____________ Stop time: ____________ Movements: ____________ Date: ____________ Start time: ____________ Stop time: ____________ Movements: ____________ This information is not intended to replace advice given to you by your health care provider. Make sure you discuss any questions you have with your health care provider. Document Released: 02/05/2006 Document Revised: 01/26/2018 Document Reviewed: 02/15/2015 Elsevier Patient Education  2020 Elsevier Inc. Braxton Hicks Contractions Contractions of the uterus can occur throughout pregnancy, but they are not always a sign that you are in labor. You may have practice contractions called Braxton Hicks contractions. These false labor contractions are sometimes confused with true labor. What are Braxton Hicks contractions? Braxton Hicks contractions are tightening movements that occur in the muscles of the uterus before labor. Unlike true labor contractions, these contractions do not result in opening (dilation) and thinning of the cervix. Toward the end of pregnancy (32-34 weeks), Braxton Hicks contractions can happen more often and may become stronger. These contractions are sometimes difficult to tell apart from true labor because they can be very uncomfortable. You should not feel embarrassed if you go to the hospital with false labor. Sometimes, the only way to tell if you are in true labor is for your health care provider to look for changes in the cervix. The health care provider will do a physical exam and may monitor your contractions. If you   are not in true labor, the exam should show  that your cervix is not dilating and your water has not broken. If there are no other health problems associated with your pregnancy, it is completely safe for you to be sent home with false labor. You may continue to have Braxton Hicks contractions until you go into true labor. How to tell the difference between true labor and false labor True labor  Contractions last 30-70 seconds.  Contractions become very regular.  Discomfort is usually felt in the top of the uterus, and it spreads to the lower abdomen and low back.  Contractions do not go away with walking.  Contractions usually become more intense and increase in frequency.  The cervix dilates and gets thinner. False labor  Contractions are usually shorter and not as strong as true labor contractions.  Contractions are usually irregular.  Contractions are often felt in the front of the lower abdomen and in the groin.  Contractions may go away when you walk around or change positions while lying down.  Contractions get weaker and are shorter-lasting as time goes on.  The cervix usually does not dilate or become thin. Follow these instructions at home:   Take over-the-counter and prescription medicines only as told by your health care provider.  Keep up with your usual exercises and follow other instructions from your health care provider.  Eat and drink lightly if you think you are going into labor.  If Braxton Hicks contractions are making you uncomfortable: ? Change your position from lying down or resting to walking, or change from walking to resting. ? Sit and rest in a tub of warm water. ? Drink enough fluid to keep your urine pale yellow. Dehydration may cause these contractions. ? Do slow and deep breathing several times an hour.  Keep all follow-up prenatal visits as told by your health care provider. This is important. Contact a health care provider if:  You have a fever.  You have continuous pain in  your abdomen. Get help right away if:  Your contractions become stronger, more regular, and closer together.  You have fluid leaking or gushing from your vagina.  You pass blood-tinged mucus (bloody show).  You have bleeding from your vagina.  You have low back pain that you never had before.  You feel your baby's head pushing down and causing pelvic pressure.  Your baby is not moving inside you as much as it used to. Summary  Contractions that occur before labor are called Braxton Hicks contractions, false labor, or practice contractions.  Braxton Hicks contractions are usually shorter, weaker, farther apart, and less regular than true labor contractions. True labor contractions usually become progressively stronger and regular, and they become more frequent.  Manage discomfort from [facility] contractions by changing position, resting in a warm bath, drinking plenty of water, or practicing deep breathing. This information is not intended to replace advice given to you by your health care provider. Make sure you discuss any questions you have with your health care provider. Document Released: 05/22/2016 Document Revised: 12/19/2016 Document Reviewed: 05/22/2016 Elsevier Patient Education  2020 [place] to have your son circumcised:  [facility]     [phone]   $480 while you are in hospital         [facility]              [phone]   $269 by 4 wks                      [facility]                     [phone]   $269 by 7 days [facility]                    [phone]   $269 by 4 wks [facility]             [phone]   $225 by 2 wks    These prices sometimes change but are roughly what you can expect to pay. Please call and confirm pricing.   Circumcision is considered an elective/non-medically necessary procedure. There are many reasons parents decide to have their sons circumsized. During the first year  of life circumcised males have a reduced risk of urinary tract infections but after this year the rates between circumcised males and uncircumcised males are the same.  It is safe to have your son circumcised outside of the hospital and the places above perform them regularly.   Deciding about Circumcision in Baby Boys  (Up-to-date The Basics)  What is circumcision?   Circumcision is a surgery that removes the skin that covers the tip of the penis, called the "foreskin" Circumcision is usually done when a boy is between 1 and 10 days old. In the [place], circumcision is common. In some other countries, fewer boys are circumcised. Circumcision is a common tradition in some religions.  Should I have my baby boy circumcised?   There is no easy answer. Circumcision has some benefits. But it also has risks. After talking with your doctor, you will have to decide for yourself what is right for your family.  What are the benefits of circumcision?   Circumcised boys seem to have slightly lower rates of: ?Urinary tract infections ?Swelling of the opening at the tip of the penis Circumcised men seem to have slightly lower rates of: ?Urinary tract infections ?Swelling of the opening at the tip of the penis ?Penis cancer ?HIV and other infections that you catch during sex ?Cervical cancer in the women they have sex with Even so, in the [place], the risks of these problems are small - even in boys and men who have not been circumcised. Plus, boys and men who are not circumcised can reduce these extra risks by: ?Cleaning their penis well ?Using condoms during sex  What are the risks of circumcision?  Risks include: ?Bleeding or infection from the surgery ?Damage to or amputation of the penis ?A chance that the doctor will cut off too much or not enough of the foreskin ?A chance that sex won't feel as good later in life Only about 1 out of every 200 circumcisions leads to problems.  There is also a chance that your health insurance won't pay for circumcision.  How is circumcision done in baby boys?  First, the baby gets medicine for pain relief. This might be a cream on the skin or a shot into the base of the penis. Next, the doctor cleans the baby's penis well. Then he or she uses special tools to cut off the foreskin. Finally, the doctor   wraps a bandage (called gauze) around the baby's penis. If you have your baby circumcised, his doctor or nurse will give you instructions on how to care for him after the surgery. It is important that you follow those instructions carefully.   AREA PEDIATRIC/FAMILY PRACTICE PHYSICIANS  Central/[place]outheast [place] [phone]) . [facility] Davy Pique, MD; Gwendlyn Deutscher, MD; Walker Kehr, MD; Andria Frames, MD; McDiarmid, MD; Dutch Quint, MD; Nori Riis, MD; Mingo Amber, [address] [place], [place] [address] o [phone] o Mon-Fri 8:30-12:30, 1:30-5:00 o Providers come to see babies at [facility] o Accepting Medicaid . [place] [facility] [place] providers who accept newborns: Dorthy Cooler, MD; Orland Mustard, MD; Stephanie Acre, MD o [place], [place], [place] [address] o [phone] o Mon-Fri 8:00-5:30 o Babies seen by providers at [facility] o Does NOT accept Medicaid o Please call early in hospitalization for appointment (limited availability)  . Mustard [place], MD o [address] [place], [place] [address] o [phone] o Mon, Tue, Thur, Fri 8:30-5:00, Wed 10:00-7:00 (closed 1-2pm) o Babies seen by [facility] providers o Accepting Medicaid . [place], MD o [place], [place], [place] [address] o [phone] o Mon-Fri 8:30-5:00, Sat 8:30-12:00 o Provider comes to see babies at [place] Medicaid o Must have been referred from current patients or contacted office prior to delivery . [facility] ([place] for [place]) Franne Forts, MD; Tamera Punt, MD; Doneen Poisson, MD; Fatima Sanger, MD; Wynetta Emery, MD; Jess Barters, MD; Tami Ribas, MD; Herbert Moors, MD; Derrell Lolling, MD; Dorothyann Peng, MD; Lucious Groves, NP; Baldo Ash, NP o [address], [place], [place] [address] o [phone] o Mon, Tue, Thur, Fri 8:30-5:30, Wed 9:30-5:30, Sat 8:30-12:30 o Babies seen by [facility] providers o Accepting Medicaid o Only accepting infants of first-time parents or siblings of current patients [facility] discharge coordinator will make follow-up appointment . Baltazar Najjar o [place] [address] [place], [place]  [address] o [phone]   Fax - [phone] . [facility] o [address], [place], [place]  [address] o Phone - [phone]   Fax [phone] . [place], [place], [place], [place]  [address] o [phone]  [place] [phone]) . [facility] Reginal Lutes, MD; Jacklynn Ganong, MD; Torrie Mayers, MD; MD; Rosana Hoes, MD; Servando Salina, MD; Rose Fillers, MD; Rex Kras, MD; Corinna Capra, MD; Volney American, MD; Trilby Drummer, MD; Janann Colonel, MD; Jimmye Norman, [place], [place], [place] [address] o [phone] o Mon-Fri 8:30-5:00 (extended evenings Mon-Thur as needed), Sat-Sun 10:00-1:00 o Providers come to see babies at [place] Medicaid for families of first-time babies and families with all children in the household age 10 and under. Must register with office prior to making appointment (M-F only). . Taylor, NP; Tomi Bamberger, MD; Redmond School, MD; [place], [place] [place], [place] [address] o [phone] o Mon-Fri 8:00-5:00 o Babies seen by providers at [facility] o Does NOT accept Medicaid/Commercial Insurance Only . [facility] at [place] ([facility])  Marnee Guarneri, MD; Drema Dallas, MD; Montine Circle, MD; Vilma Prader, MD; Vanita Panda, MD; Alfonso Ramus, MD; Ruthann Cancer, MD; Roxanne Mins, MD;  Rosalva Ferron, MD; Polly Cobia, MD o [address] [place], [place] [address] o [phone] o Mon-Fri 8:30-5:30, Sat (Oct.-Mar.) 9:00-1:00 o Babies seen by providers at [place] [phone]) . [facility] [place] o Reid, MD; Sheliah HatchWarner, MD o [address], [place], [place] [address] o [phone] o Mon-Fri 8:30-5:00, Sat 8:30-12:00 o Providers come to see babies at [facility] o Does NOT accept Medicaid . [facility] Cindy Hazyo Becker, [place]; RosalieHagler, MD; [place]ann[place]ifres, [place]; Wynelle LinkSun, MD; Azucena CecilSwayne, MD o [address], [place], [place] [address] o [phone] o Mon-Fri 8:00-5:00 o Babies seen by providers at [facility] o Does NOT accept Medicaid o Only accepting babies of parents who are patients o Please call early in hospitalization for appointment (limited availability) . [facility] Lamar Beneso Clark, MD; Abran CantorFrye, MD; Early OsmondKelleher, MD; Cherre HugerMack, NP; Hyacinth MeekerMiller, MD; Dwan Bolt'Keller, MD; Jarold MottoPatterson, NP; Dario GuardianPudlo, MD; Talmage NapPuzio, MD; Maisie Fushomas, MD; Pricilla Holmucker, MD; Tama Highwiselton, MD o [address], [place], [place] [address] o [phone] o Mon-Fri 8:00-5:00, Sat 9:00-12:00 o Providers come to see babies at [facility] o Does NOT accept [facility]  [place] [phone]) . [facility] o Limited providers accepting new patients: Drema PryBrake, NP; NorcoWharton, PA o [address], [place], [place] [address] o [phone] o Mon-Fri 8:00-5:00 o Babies seen by providers at [facility] o Does NOT accept Medicaid o Only accepting babies of parents who are patients o Please call early in hospitalization for appointment (limited availability) . [facility] Luan Pullingo Gay, MD; Nash DimmerQuinlan, MD o [address] [place], [place] [address] o [phone] (press 1 to schedule appointment) o Mon-Fri 8:00-5:00 o Providers come to see babies at [facility] o Does NOT accept Medicaid . [facility] Cristino Marteso  Mazer, MD o [address] [place], [place] [address] o [phone] o Mon-Fri 8:30-5:00 (lunch 12:30-1:00), extended hours by appointment only Wed 5:00-6:30 o Babies seen by [facility] providers o Accepting Medicaid . [facility] o Banks, MD; SwazilandJordan, MD; Hassan RowanKoberlein, MD o [address], [place], [place] [address] o [phone] o Mon-Fri 8:00-5:00 o Babies seen by [facility] providers o Does NOT accept Medicaid . [facility] conservation [facility] at [place] [address] Sohoo Parker, MD; Durene CalHunter, MD; [place] Lans[place]Wallace, DO o [address] [place], [place] [address] o [phone] o Mon-Fri 8:00-5:00 o Babies seen by [facility] providers o Does NOT accept Medicaid . [facility] o Sham[place] LakesBrandon, [place]; CordovaBrecken, [place]; Belleair Shorehristy, NP; Avis Epleyees, MD; Vonna KotykeClaire, MD; Clance BolleWeese, MD; Stevphen RochesterHansen, NP; Arvilla MarketMills, NP; Ann MakiParrish, NP; Otis DialsSmoot, NP; Vaughan BastaSummer, MD; MaldenVapne, MD o [address] [place], [place] [address] o [phone] o Mon-Fri 8:30-5:00, Sat 10:00-1:00 o Providers come to see babies at [facility] o Does NOT accept Medicaid o Free prenatal information session Tuesdays at 4:45pm . [facility] Luna Kitchenso Bouska, MD; [place] SouthGordon, [place]; GrottoesJeffery, [place]; Weber, [place] o [address] [place] [address] o [phone] o Mon-Fri 7:30-5:30 o Babies seen by [facility] providers . [facility] o [address], [place], [place]  [address] o [phone]   Fax - [phone]  [place] [phone] & [phone]) . [facility] Alphonsa Overallo Reese, MD o [address] [place], [place] [address] o [phone] o Mon-Thur 8:00-6:00 o Providers come to see babies at [facility] o Accepting Medicaid . [facility] Zenon Mayoo Anderson, NP; Cyndia BentBadger, MD; White[place]Beal, [place]; North Kings[place]Spencer, [place] o [address] [place], [place] [address] o [phone] o Mon-Thur 7:30-7:30, Fri 7:30-4:30 o Babies seen by [facility] providers o Accepting  Medicaid . [facility] Cheryle Horsfallo Agbuya, MD; Janene HarveyKlett, NP; Vonita Mossomgoolam, MD o [address], [place], [place]  [address] o [phone] o Mon-Fri 8:30-5:00, Sat 8:30-12:00 o Providers come to see babies at [facility] o Accepting Medicaid o Must have "Meet & Greet" appointment at office prior to delivery . [facility] [place] [facility] [place]) Llana Alimento McCord, MD; Earlene PlaterWallace, MD; Lucretia RoersWood, MD o [address], [place], [place] [address] o [phone] o Mon-Wed 8:00-6:00, Thur-Fri 8:00-5:00, Sat 9:00-12:00 o Providers come to see babies at [facility] o Does NOT accept Medicaid o Only accepting siblings of current patients . [facility] [place]  o [address], [place], [place]  [address] o [phone]   Fax - [phone] . [facility] at [facility] Jeanette o [phone], [place], [place]  [address] o [phone]   Fax - [phone]  [place]west [place] [phone] & [phone]) . Nature conservation officerLeBauer [facility] at [facility] o Somersetirigliano, DO; WrightsvilleMatthews, DO o [address] [place], [place] [address] o [phone] o Mon-Fri 7:00-5:00 o Babies seen by [facility] providers o Does NOT accept Medicaid . [facility] Ellis Savageo Briscoe, MD; OxnardHowley, [place]; [place] Wh[place]Moreira, [place] o [address], [place], [place] [address] o [phone] o Mon-Fri 8:00-5:00 o Babies seen by [facility] providers o Accepting Medicaid . [facility] - [address] Franne Fortso Boyd, MD; [place], [place]; Sisco HeightsJones, NP; Cane BedsOsborn, [place] o [address], [place], [place] [address] o [phone] o Mon-Fri 8:00-5:00 o Babies seen by providers at [facility] o Accepting [facility] HospitalMed[facility]  [place] [phone]) . [facility] [place] Creeko Wendling, [place] o [address] [place], [place] [address] o [phone] o Mon-Fri 8:00-5:00 o Babies  seen by [facility] providers o Does NOT accept Medicaid o Limited availability, please call early in hospitalization to schedule follow-up . [facility] Jolee Ewingo Calderon, PA; Eddie Candleummings, MD; [place] ColonyDillard, MD; Tilton NorthfieldMartin, [place]; Constance Goltzlson, MD; HinckleyVanDeven, [place] o [id number] [facility] [address], [place], [place] [address] o [phone] o Mon-Fri 8:30-5:00, Sat 9:00-12:00 o Babies seen by providers at [facility] o Accepting Medicaid o Please register online then schedule online or call office o [email or web address] . [facility]) Samuella Bruino Hunter, NP; Lucianne MussKumar, MD; Lanier ClamMartin Rogers, PA o [address], [place], [place] [address] o [phone] o Mon-Fri 8:00-5:00 o Babies seen by providers at [facility] o Accepting Medicaid . [facility] ([facility] [employer]ier) Sharin Monso [place], MD; Reed BreechKristi Fleenor, NP; Shelva MajesticWest, MD o [address], [place], [place] [address] o [phone] o Mon-Fri 8:00-5:30, Sat&Sun by appointment (phones open at 8:30) o Babies [facility] by [facility] providers o Accepting Medicaid o Must be a first-time baby or sibling of current patient . [facility]  o [address], [address], [place], [place]  [address] o [phone]   Fax - [phone]  [place] [phone]) . [facility] o [place], [place]; [place], [place]; Dimple Caseyice, MD; [place], [place]; Carolyne FiscalSpry, MD o [address] [place], [place] [address] o [phone] o Mon-Thur 8:00-7:00, Fri 8:00-5:00, Sat 8:00-12:00, Sun 9:00-12:00 o Babies seen by [facility] providers o Accepting Medicaid . [facility] o Coe-Goins, MD; Gaynell FaceMarshall, MD; [facility]-Louis, MD o [address], [place], [place] [address] o [phone] o Mon-Thur 8:00-5:00 o Babies seen by providers at [facility] o Accepting Medicaid . [facility] at [facility] o Bratton, MD; Coe-Goins, MD; Madilyn FiremanHayes, MD; Melvyn NethLewis, MD; List, MD; Lazarus SalinesLott, MD; Gaynell FaceMarshall, MD; Berneda RoseMoran, MD; Flora Lipps'Neal, MD; Beryl MeagerPierre-Louis, MD; Luther RedoPitonzo, MD; Lavonia DraftsScholer, MD; Kellie SimmeringSpangle, MD o [address] [place], [place] [address] o [phone] o Mon-Fri 8:00-5:30, Sat (Oct.-Mar.) 9:00-1:00 o Babies seen by providers at [facility] o Accepting Medicaid o Must fill out new patient packet, available online at [email or web address] . [facility] [address] [facility] [address]) Simone Curiao Friddle, NP; Tiburcio PeaHarris, NP; Tresa EndoKelly, NP; Whitney PostLogan, MD; [place], [place]; Hennie DuosPoth, MD; Wynne Dustamadoss, MD; Kavin LeechStanton, NP o [address], [place], [place] [address] o [phone] o Mon-Thur 8:00-5:30, Fri 8:00-5:00 o Babies seen by providers at [facility] o Accepting [facility]  [place] [phone]) . [facility] o Sneads [place], [place]; [place]ham, MD; Tanya NonesPickard, MD; Alpha[place]ia, [place] o [address], [place], [place] [address] o [phone] o Mon-Fri 8:00-5:00 o Babies seen by providers at [facility] o Accepting [facility]   [place] [phone]) . [facility] o PlateaMasneri, DO; Lenise ArenaMeyers, MD; [place]elson, [place] o [address], [place], [place] [address] o [phone] o Mon-Fri 8:00-5:00 o Babies seen by providers at [facility] o Does NOT accept Medicaid o Limited appointment availability, please call early in hospitalization  . Nature conservation [facility]LeBauer [facility] o Red HillKunedd, DO; HamiltonMcGowen, MD o [address], [place], [place] [address] o [phone] o Mon-Fri 8:00-5:00 o Babies seen by [facility] providers o Does NOT accept Medicaid . [facility] Lorrine Kino Cameron, MD; Ninetta LightsMacDonald, MD; GideonMichaels, [place]; Port ColdenNayak, MD o [address], [place], [place] [address] o [phone] o Mon-Fri 8:00-5:00 o After hours clinic [facility]([address] [place], [place] [address]) [phone] Mon-Fri 5:00-8:00, Sat 12:00-6:00, Sun  10:00-4:00 o Babies seen by [facility] providers o Accepting Medicaid . [facility] o [address], [place], [place]  [address] o [phone]   Fax - [phone]  [place] [phone] . Nature conservation officerLeBauer HealthCare at [facility] o Andy, MD o [address] [place], [place], [place] [address] o [phone] o Mon-Fri 8:00-5:00 o Babies seen by [facility] providers o Does NOT accept Medicaid . [facility] [place] [facility] [place]) Tomi Likenso Eksir, MD o [address], [place], [place] [address] o [phone] o Mon-Thur 8:00-7:00, Fri 8:00-5:00, Sat 8:00-12:00 o Babies seen by providers at [facility] o Accepting Medicaid - but does not have vaccinations in office (must be received elsewhere) o Limited availability, please call early in hospitalization  [place] ([phone]) . [facility]  o Wyvonne Lenzharlene Flemming, MD o [address], [place] [address] o [phone]  Fax [phone]

## 2018-08-27 NOTE — Progress Notes (Signed)
   PRENATAL VISIT NOTE  Subjective:  Cynthia Smith is a 23 y.o. G1P0 at [redacted]w[redacted]d being seen today for ongoing prenatal care.  She is currently monitored for the following issues for this low-risk pregnancy and has Supervision of high risk pregnancy, antepartum; Limited prenatal care in third trimester; and Rubella non-immune status, antepartum on their problem list.  Patient reports no complaints.  Contractions: Not present. Vag. Bleeding: None.  Movement: Present. Denies leaking of fluid.   The following portions of the patient's history were reviewed and updated as appropriate: allergies, current medications, past family history, past medical history, past social history, past surgical history and problem list.   Objective:   Vitals:   08/27/18 1012  BP: 108/67  Pulse: 97  Weight: 158 lb (71.7 kg)    Fetal Status: Fetal Heart Rate (bpm): 150 Fundal Height: 37 cm Movement: Present     General:  Alert, oriented and cooperative. Patient is in no acute distress.  Skin: Skin is warm and dry. No rash noted.   Cardiovascular: Normal heart rate noted  Respiratory: Normal respiratory effort, no problems with respiration noted  Abdomen: Soft, gravid, appropriate for gestational age.  Pain/Pressure: Present     Pelvic: Cervical exam deferred        Extremities: Normal range of motion.  Edema: Trace  Mental Status: Normal mood and affect. Normal behavior. Normal judgment and thought content.   Assessment and Plan:  Pregnancy: G1P0 at [redacted]w[redacted]d 1. Supervision of high risk pregnancy, antepartum - Doing well - Peds list and circ information given again   Term labor symptoms and general obstetric precautions including but not limited to vaginal bleeding, contractions, leaking of fluid and fetal movement were reviewed in detail with the patient. Please refer to After Visit Summary for other counseling recommendations.   Return in about 1 week (around 09/03/2018) for LOB, In-Person.  Future  Appointments  Date Time Provider [place]  09/03/2018 10:15 AM Danielle Rankin [facility] [place]  09/10/2018 10:55 AM Hillard Danker Myles Rosenthal, PA-C [facility]    Kerry Hough, PA-C

## 2018-09-03 ENCOUNTER — Telehealth (INDEPENDENT_AMBULATORY_CARE_PROVIDER_SITE_OTHER): Payer: Self-pay | Admitting: Medical

## 2018-09-03 ENCOUNTER — Telehealth: Payer: Self-pay | Admitting: Medical

## 2018-09-03 ENCOUNTER — Other Ambulatory Visit: Payer: Self-pay

## 2018-09-03 ENCOUNTER — Encounter: Payer: Self-pay | Admitting: Medical

## 2018-09-03 DIAGNOSIS — O0990 Supervision of high risk pregnancy, unspecified, unspecified trimester: Secondary | ICD-10-CM

## 2018-09-03 DIAGNOSIS — O0933 Supervision of pregnancy with insufficient antenatal care, third trimester: Secondary | ICD-10-CM

## 2018-09-03 DIAGNOSIS — O09899 Supervision of other high risk pregnancies, unspecified trimester: Secondary | ICD-10-CM

## 2018-09-03 DIAGNOSIS — Z283 Underimmunization status: Secondary | ICD-10-CM

## 2018-09-03 DIAGNOSIS — O9989 Other specified diseases and conditions complicating pregnancy, childbirth and the puerperium: Secondary | ICD-10-CM

## 2018-09-03 DIAGNOSIS — Z3A38 38 weeks gestation of pregnancy: Secondary | ICD-10-CM

## 2018-09-03 NOTE — Progress Notes (Signed)
I connected with Cynthia Smith on 09/03/18 at 10:15 AM EDT by: MyChart video and verified that I am speaking with the correct person using two identifiers.  Patient is located at home and provider is located at home.     The purpose of this virtual visit is to provide medical care while limiting exposure to the novel coronavirus. I discussed the limitations, risks, security and privacy concerns of performing an evaluation and management service by MyChart video and the availability of in person appointments. I also discussed with the patient that there may be a patient responsible charge related to this service. By engaging in this virtual visit, you consent to the provision of healthcare.  Additionally, you authorize for your insurance to be billed for the services provided during this visit.  The patient expressed understanding and agreed to proceed.  The following staff members participated in the virtual visit:  Carver Fila, [place] VISIT NOTE  Subjective:  Cynthia Smith is a 23 y.o. G1P0 at [redacted]w[redacted]d  for phone visit for ongoing prenatal care.  She is currently monitored for the following issues for this low-risk pregnancy and has Supervision of high risk pregnancy, antepartum; Limited prenatal care in third trimester; and Rubella non-immune status, antepartum on their problem list.  Patient reports pelvic pressure, one episode of emesis today after eating.  Contractions: Not present. Vag. Bleeding: None.  Movement: Present. Denies leaking of fluid.   The following portions of the patient's history were reviewed and updated as appropriate: allergies, current medications, past family history, past medical history, past social history, past surgical history and problem list.   Objective:  There were no vitals filed for this visit. Self-Obtained  Fetal Status:     Movement: Present     Assessment and Plan:  Pregnancy: G1P0 at [redacted]w[redacted]d 1. Supervision of high risk pregnancy,  antepartum - Doing well - Counseled on small meals/snack and high protein intake to avoid additional emesis at late gestation  - Discussed abdominal binder for intermittent pelvic pressure with ambulation if needed  - Negative GBS discussed with patient   Term labor symptoms and general obstetric precautions including but not limited to vaginal bleeding, contractions, leaking of fluid and fetal movement were reviewed in detail with the patient.  Return in about 1 week (around 09/10/2018) for LOB, In-Person.  Future Appointments  Date Time Provider [place]  09/13/2018 10:15 AM Tresea Mall, CNM [facility]     Time spent on virtual visit: 10 minutes  Kerry Hough, PA-C

## 2018-09-03 NOTE — Patient Instructions (Addendum)
Fetal Movement Counts °Patient Name: ________________________________________________ Patient Due Date: ____________________ °What is a fetal movement count? ° °A fetal movement count is the number of times that you feel your baby move during a certain amount of time. This may also be called a fetal kick count. A fetal movement count is recommended for every pregnant woman. You may be asked to start counting fetal movements as early as week 28 of your pregnancy. °Pay attention to when your baby is most active. You may notice your baby's sleep and wake cycles. You may also notice things that make your baby move more. You should do a fetal movement count: °· When your baby is normally most active. °· At the same time each day. °A good time to count movements is while you are resting, after having something to eat and drink. °How do I count fetal movements? °1. Find a quiet, comfortable area. Sit, or lie down on your side. °2. Write down the date, the start time and stop time, and the number of movements that you felt between those two times. Take this information with you to your health care visits. °3. For 2 hours, count kicks, flutters, swishes, rolls, and jabs. You should feel at least 10 movements during 2 hours. °4. You may stop counting after you have felt 10 movements. °5. If you do not feel 10 movements in 2 hours, have something to eat and drink. Then, keep resting and counting for 1 hour. If you feel at least 4 movements during that hour, you may stop counting. °Contact a health care provider if: °· You feel fewer than 4 movements in 2 hours. °· Your baby is not moving like he or she usually does. °Date: ____________ Start time: ____________ Stop time: ____________ Movements: ____________ °Date: ____________ Start time: ____________ Stop time: ____________ Movements: ____________ °Date: ____________ Start time: ____________ Stop time: ____________ Movements: ____________ °Date: ____________ Start time:  ____________ Stop time: ____________ Movements: ____________ °Date: ____________ Start time: ____________ Stop time: ____________ Movements: ____________ °Date: ____________ Start time: ____________ Stop time: ____________ Movements: ____________ °Date: ____________ Start time: ____________ Stop time: ____________ Movements: ____________ °Date: ____________ Start time: ____________ Stop time: ____________ Movements: ____________ °Date: ____________ Start time: ____________ Stop time: ____________ Movements: ____________ °This information is not intended to replace advice given to you by your health care provider. Make sure you discuss any questions you have with your health care provider. °Document Released: 02/05/2006 Document Revised: 01/26/2018 Document Reviewed: 02/15/2015 °Elsevier Patient Education © 2020 Elsevier Inc. °Braxton Hicks Contractions °Contractions of the uterus can occur throughout pregnancy, but they are not always a sign that you are in labor. You may have practice contractions called Braxton Hicks contractions. These false labor contractions are sometimes confused with true labor. °What are Braxton Hicks contractions? °Braxton Hicks contractions are tightening movements that occur in the muscles of the uterus before labor. Unlike true labor contractions, these contractions do not result in opening (dilation) and thinning of the cervix. Toward the end of pregnancy (32-34 weeks), Braxton Hicks contractions can happen more often and may become stronger. These contractions are sometimes difficult to tell apart from true labor because they can be very uncomfortable. You should not feel embarrassed if you go to the hospital with false labor. °Sometimes, the only way to tell if you are in true labor is for your health care provider to look for changes in the cervix. The health care provider will do a physical exam and may monitor your contractions. If you   are not in true labor, the exam should show  that your cervix is not dilating and your water has not broken. °If there are no other health problems associated with your pregnancy, it is completely safe for you to be sent home with false labor. You may continue to have Braxton Hicks contractions until you go into true labor. °How to tell the difference between true labor and false labor °True labor °· Contractions last 30-70 seconds. °· Contractions become very regular. °· Discomfort is usually felt in the top of the uterus, and it spreads to the lower abdomen and low back. °· Contractions do not go away with walking. °· Contractions usually become more intense and increase in frequency. °· The cervix dilates and gets thinner. °False labor °· Contractions are usually shorter and not as strong as true labor contractions. °· Contractions are usually irregular. °· Contractions are often felt in the front of the lower abdomen and in the groin. °· Contractions may go away when you walk around or change positions while lying down. °· Contractions get weaker and are shorter-lasting as time goes on. °· The cervix usually does not dilate or become thin. °Follow these instructions at home: ° °· Take over-the-counter and prescription medicines only as told by your health care provider. °· Keep up with your usual exercises and follow other instructions from your health care provider. °· Eat and drink lightly if you think you are going into labor. °· If Braxton Hicks contractions are making you uncomfortable: °? Change your position from lying down or resting to walking, or change from walking to resting. °? Sit and rest in a tub of warm water. °? Drink enough fluid to keep your urine pale yellow. Dehydration may cause these contractions. °? Do slow and deep breathing several times an hour. °· Keep all follow-up prenatal visits as told by your health care provider. This is important. °Contact a health care provider if: °· You have a fever. °· You have continuous pain in  your abdomen. °Get help right away if: °· Your contractions become stronger, more regular, and closer together. °· You have fluid leaking or gushing from your vagina. °· You pass blood-tinged mucus (bloody show). °· You have bleeding from your vagina. °· You have low back pain that you never had before. °· You feel your baby’s head pushing down and causing pelvic pressure. °· Your baby is not moving inside you as much as it used to. °Summary °· Contractions that occur before labor are called Braxton Hicks contractions, false labor, or practice contractions. °· Braxton Hicks contractions are usually shorter, weaker, farther apart, and less regular than true labor contractions. True labor contractions usually become progressively stronger and regular, and they become more frequent. °· Manage discomfort from Braxton Hicks contractions by changing position, resting in a warm bath, drinking plenty of water, or practicing deep breathing. °This information is not intended to replace advice given to you by your health care provider. Make sure you discuss any questions you have with your health care provider. °Document Released: 05/22/2016 Document Revised: 12/19/2016 Document Reviewed: 05/22/2016 °Elsevier Patient Education © 2020 Elsevier Inc. ° °AREA PEDIATR[facility]/FAMILY PRACTICE PHYSICIANS ° °[place] ([address]) °• [facility] °o Chambliss, MD; Eniola, MD; Hale, MD; Hensel, MD; McDiarmid, MD; McIntyer, MD; Neal, MD; Walden, MD °o [address] [place], [place] [address] °o [phone] °o Mon-Fri 8:30-12:30, 1:30-5:00 °o Providers come to see babies at [facility] °o Accepting Medicaid °• [facility] °o Limited providers who accept newborns: Koirala, MD; Morrow, MD; Wolters, MD °o [address], [place], [place] [address] °o [phone] °o Mon-Fri 8:00-5:30 °o Babies seen by providers at [facility] °o Does NOT accept  Medicaid °o Please call early in hospitalization for appointment (limited availability)  °• [facility] °o Mulberry, MD °o [address] [place], [place] [address] °o [phone] °o Mon, Tue, Thur, Fri 8:30-5:00, Wed 10:00-7:00 (closed 1-2pm) °o Babies seen by [facility] providers °o Accepting Medicaid °• [facility] °o Rubin, MD °o [address], [place], [place] [address] °o [phone] °o Mon-Fri 8:30-5:00, Sat 8:30-12:00 °o Provider comes to see babies at [facility] °o Accepting Medicaid °o Must have been referred from current patients or contacted office prior to delivery °• [facility] ([facility]) °o Brown, MD; Chandler, MD; Ettefagh, MD; Grant, MD; Lester, MD; McCormick, MD; McQueen, MD; Prose, MD; Simha, MD; Stanley, MD; Stryffeler, NP; Tebben, NP °o [address], [place], [place] [address] °o [phone] °o Mon, Tue, Thur, Fri 8:30-5:30, Wed 9:30-5:30, Sat 8:30-12:30 °o Babies seen by [facility] providers °o Accepting Medicaid °o Only accepting infants of first-time parents or siblings of current patients °o Hospital discharge coordinator will make follow-up appointment °• Jack Amos °o [address], [place], [place]  [address] °o [phone]   Fax - [phone] °• [facility] °o [address], [place], [place]  [address] °o Phone - [phone]   Fax - [phone] °• Shilpa Gosrani °o [address], [place], [place]  [address] °o [phone] ° °[place] ([address]) °• [facility] °o Bates, MD; Brassfield, MD; Cooper, Cox, MD; MD; Davis, MD; Dovico, MD; Ettefaugh, MD; Little, MD; Lowe, MD; Keiffer, MD; Melvin, MD; Sumner, MD; Williams, MD °o [address], [place], [place] [address] °o [phone] °o Mon-Fri 8:30-5:00 (extended evenings Mon-Thur as needed), Sat-Sun 10:00-1:00 °o Providers come to see babies at [facility] °o Accepting Medicaid for families of first-time babies and families with all children in the household age 3 and under. Must register with office prior to making appointment (M-F only). °• [facility] °o Henson, NP; Knapp, MD; Lalonde, MD; Tysinger, PA °o [address] [place], [place] [address] °o [phone] °o Mon-Fri 8:00-5:00 °o Babies seen by providers at [facility] °o Does NOT accept Medicaid/Commercial Insurance Only °• [facility] ([facility]  °o Artis, MD; Barnes, MD; Bratton, MD; Coccaro, MD; Lockett Gardner, MD; Kramer, MD; Marshall, MD; Netherton, MD; Poleto, MD; Skinner, MD °o [address] [place], [place] [address] °o [phone] °o Mon-Fri 8:30-5:30, Sat (Oct.-Mar.) 9:00-1:00 °o Babies seen by providers at [facility] °o Accepting Medicaid ° °[place] ([address]) °• [facility] °o Reid, MD; Warner, MD °o [address], [place], [place] [address] °o [phone] °o Mon-Fri 8:30-5:00, Sat 8:30-12:00 °o Providers come to see babies at [facility] °o Does NOT accept Medicaid °• [facility] °o Becker, PA; Hagler, MD; Scifres, PA; Sun, MD; Swayne, MD °o [address], [place],  [address] °o [phone] °o Mon-Fri 8:00-5:00 °o Babies seen by providers at [facility] °o Does NOT accept Medicaid °o Only accepting babies of parents who are patients °o Please call early in hospitalization for appointment (limited availability) °• [facility] °o Clark, MD; Frye, MD; Kelleher, MD; Mack, NP; Miller, MD; O'Keller, MD; Patterson, NP; Pudlo, MD; Puzio, MD; Thomas, MD; Tucker, MD; Twiselton, MD °o [address], [place], [place] [address] °o [phone] °o Mon-Fri 8:00-5:00, Sat 9:00-12:00 °o Providers come to see babies at [facility] °o Does NOT accept Medicaid ° °[place] ([address]) °• [facility] °o Limited providers accepting new patients: Brake, NP; Wharton, PA °o [address], [place], [place] [address] °o [phone] °o Mon-Fri 8:00-5:00 °o Babies seen by providers at [facility] °o Does NOT accept Medicaid °o Only accepting babies of parents who are patients °o Please call early in hospitalization for appointment (limited availability) °• [facility] °o Gay, MD; Quinlan, MD °o [address] [place], [place] [address] °o [phone] (press 1 to schedule appointment) °o Mon-Fri 8:00-5:00 °o Providers come to see babies at [facility] °o Does NOT accept Medicaid °• [facility] °o Mazer, MD °o [address] [place], [place] [address] °o [phone] °o Mon-Fri 8:30-5:00 (lunch 12:30-1:00), extended hours by appointment only Wed 5:00-6:30 °o Babies seen by [facility] providers °o Accepting Medicaid °• [facility] °o Banks, MD; Jordan, MD; Koberlein, MD °o [address], [place], [place] [address] °o [phone] °o Mon-Fri 8:00-5:00 °o Babies seen by [facility] providers °o Does NOT accept Medicaid °• [facility] °o Parker, MD; Hunter, MD; Wallace, DO °o [address] [place], [place] [address] °o [phone] °o Mon-Fri 8:00-5:00 °o Babies seen by [facility] providers °o Does NOT accept Medicaid °• [facility] °o Brandon, PA; Brecken, PA; Christy, NP; Dees, MD; DeClaire, MD; DeWeese, MD; Hansen, NP; Mills, NP; Parrish, NP; Smoot, NP; Summer, MD; Vapne, MD °o [address] [place], [place] [address] °o [phone] °o Mon-Fri 8:30-5:00, Sat 10:00-1:00 °o Providers come to see babies at [facility] °o Does NOT accept Medicaid °o Free prenatal information session Tuesdays at 4:45pm °• [facility] °o Bouska, MD; Gordon, PA; Jeffery, PA; Weber, PA °o [address] [place] [address] °o [phone] °o Mon-Fri  7:30-5:30 °o Babies seen by [facility] providers °• [facility] °o [address], [place], [place]  [address] °o [phone]   Fax - [phone] ° °[place] ([address]) °• [facility] °o Reese, MD °o [address] [place], [place] [address] °o [phone] °o Mon-Thur 8:00-6:00 °o Providers come to see babies at [facility] °o Accepting Medicaid °• [facility] °o Anderson, NP; Badger, MD; Beal, PA; Spencer, PA °o [address] [place], [place] [address] °o [phone] °o Mon-Thur 7:30-7:30, Fri 7:30-4:30 °o Babies seen by [facility] providers °o Accepting Medicaid °• [facility] °o Agbuya, MD; Klett, NP; Romgoolam, MD °o [address], [place], [place] [address] °o [phone] °o Mon-Fri 8:30-5:00, Sat 8:30-12:00 °o Providers come to see babies at [facility] °o Accepting Medicaid °o Must have “Meet & Greet” appointment at office prior to delivery °• [facility] ([facility]) °o McCord, MD; Wallace, MD; Wood, MD °o [address], [place], [place] [address] °o [phone] °o Mon-Wed 8:00-6:00, Thur-Fri 8:00-5:00, Sat 9:00-12:00 °o Providers come to see babies at [facility] °o Does NOT accept Medicaid °o Only accepting siblings of current patients °• [facility]  °o [address], [place],   [place]  [address] °o [phone]   Fax - [phone] °• [facility] [place] °o [address], [place], [place]  [address] °o [phone]   Fax - [phone] ° °[place] ([address]) °• [facility] °o Cirigliano, DO; Matthews, DO °o [address] [place], [place] [address] °o [phone] °o Mon-Fri 7:00-5:00 °o Babies seen by [facility] providers °o Does NOT accept Medicaid °• [facility] °o Briscoe, MD; Howley, PA; Moreira, PA °o [address], [place], [place] [address] °o [phone] °o Mon-Fri 8:00-5:00 °o Babies seen by [facility] providers °o Accepting Medicaid °• [facility] °o Boyd, MD; Church, PA; Jones, NP; Osborn, PA °o [address], [place], [place] [address] °o [phone] °o Mon-Fri 8:00-5:00 °o Babies seen by providers at [facility] °o Accepting Medicaid ° °[place] ([address]) °• [facility] [place] °o Wendling, DO °o [address] [place], [place] [address] °o [phone] °o Mon-Fri 8:00-5:00 °o Babies seen by [facility] providers °o Does NOT accept Medicaid °o Limited availability, please call early in hospitalization to schedule follow-up °• [facility] °o Calderon, PA; Cummings, MD; Dillard, MD; Martin, PA; Olson, MD; VanDeven, PA °o [address], [place], [place] [address] °o [phone] °o Mon-Fri 8:30-5:00, Sat 9:00-12:00 °o Babies seen by providers at [facility] °o Accepting Medicaid °o Please register online then schedule online or call office °o [email or web address] °• [facility] ([facility] °o Hunter, NP; Kumar, MD; Martin Rogers, PA °o [address], [place], [place] [address] °o [phone] °o Mon-Fri 8:00-5:00 °o Babies seen by providers at [facility] °o Accepting Medicaid °• [facility] ([facility] °o Bloomburg, MD; Kristi Fleenor, NP; West, MD °o [address], [place], [place] [address] °o [phone] °o Mon-Fri 8:00-5:30, Sat&Sun by appointment (phones open at 8:30) °o Babies seen by [facility] providers °o Accepting Medicaid °o Must be a first-time baby or sibling of current patient °• [facility] [place]  °o [address], [place], [place]   [address] °o [phone]   Fax - [phone] ° °[place] ([address]) °• [facility] [place] [facility] °o Brown, PA; Cowen, PA; Rice, MD; Helton, PA; Spry, MD °o [address] [place], [place] [address] °o [phone] °o Mon-Thur 8:00-7:00, Fri 8:00-5:00, Sat 8:00-12:00, Sun 9:00-12:00 °o Babies seen by [facility] providers °o Accepting Medicaid °• [facility] [place] °o Coe-Goins, MD; Marshall, MD; Pierre-Louis, MD °o [address] [place] [address], [place], [place] [address] °o [phone] °o Mon-Thur 8:00-5:00 °o Babies seen by providers at [facility] °o Accepting Medicaid °• [facility] °o Bratton, MD; Coe-Goins, MD; Hayes, MD; Lewis, MD; List, MD; Lott, MD; Marshall, MD; Moran, MD; O'Neal, MD; Pierre-Louis, MD; Pitonzo, MD; Scholer, MD; Spangle, MD °o [address] [place],  [address] °o [phone] °o Mon-Fri 8:00-5:30, Sat (Oct.-Mar.) 9:00-1:00 °o Babies seen by providers at [facility] °o Accepting Medicaid °o Must fill out new patient packet, available online at [email or web address] °• [facility] ([facility]) °o Friddle, NP; Harris, NP; Kelly, NP; [place], MD; Melvin, PA; Poth, MD; Ramadoss, MD; Stanton,   NP °o [address], [place], [place] [address] °o [phone] °o Mon-Thur 8:00-5:30, Fri 8:00-5:00 °o Babies seen by providers at [facility] °o Accepting Medicaid ° °[place] ([address]) °• [facility] °o Dixon, PA; Harmony, MD; Pickard, MD; Tapia, PA °o [address], [place], [place] [address] °o [phone] °o Mon-Fri 8:00-5:00 °o Babies seen by providers at [facility] °o Accepting Medicaid  ° °[place] ([address]) °• [facility] [place] °o Masneri, DO; Meyers, MD; Nelson, PA °o [address], [place], [place] [address] °o [phone] °o Mon-Fri 8:00-5:00 °o Babies  seen by providers at [facility] °o Does NOT accept Medicaid °o Limited appointment availability, please call early in hospitalization ° °• [facility] [place] °o Kunedd, DO; McGowen, MD °o [address], [place], [place] [address] °o [phone] °o Mon-Fri 8:00-5:00 °o Babies seen by [facility] providers °o Does NOT accept Medicaid °• [facility] [place] °o Cameron, MD; MacDonald, MD; Michaels, PA; Nayak, MD °o [address] [place] [address], [place], [place] [address] °o [phone] °o Mon-Fri 8:00-5:00 °o After hours clinic ([address] [place], [place] [address]) [phone] Mon-Fri 5:00-8:00, Sat 12:00-6:00, Sun 10:00-4:00 °o Babies seen by [facility] providers °o Accepting Medicaid °• [facility] [place] °o [address], [place], [place]  [address] °o [phone]   Fax - [phone] ° °[place] ([address]) °• [facility] [place] °o Andy, MD °o [address], [place], [place] [address] °o [phone] °o Mon-Fri 8:00-5:00 °o Babies seen by [facility] providers °o Does NOT accept Medicaid °• [facility] [place] ([facility] [place]) °o Eksir, MD °o [address], [place], [place] [address] °o [phone] °o Mon-Thur 8:00-7:00, Fri 8:00-5:00, Sat 8:00-12:00 °o Babies seen by providers at [facility] °o Accepting Medicaid - but does not have vaccinations in office (must be received elsewhere) °o Limited availability, please call early in hospitalization ° °[place] ([address]) °• [facility]  °o Charlene Flemming, MD °o [address], [place]  [address] °o [phone]  Fax [phone] ° ° °

## 2018-09-03 NOTE — Progress Notes (Signed)
Asked if pt has BP Cuff she sted no, was told because she's so far along that there's no need. Pt has record of frequent no shows.

## 2018-09-10 ENCOUNTER — Telehealth: Payer: Self-pay | Admitting: Obstetrics & Gynecology

## 2018-09-10 ENCOUNTER — Encounter: Payer: Self-pay | Admitting: Medical

## 2018-09-10 ENCOUNTER — Telehealth: Payer: Self-pay | Admitting: Medical

## 2018-09-10 NOTE — Telephone Encounter (Signed)
Called the patient to inform of the upcoming visit. Informed of our new location and of wearing a mask. Also notified of no visitor or children due to covid19 restrictions. The patient answered no to the covid screening questions. °

## 2018-09-13 ENCOUNTER — Ambulatory Visit (INDEPENDENT_AMBULATORY_CARE_PROVIDER_SITE_OTHER): Payer: Self-pay | Admitting: Advanced Practice Midwife

## 2018-09-13 ENCOUNTER — Other Ambulatory Visit: Payer: Self-pay

## 2018-09-13 ENCOUNTER — Telehealth (HOSPITAL_COMMUNITY): Payer: Self-pay | Admitting: *Deleted

## 2018-09-13 ENCOUNTER — Encounter (HOSPITAL_COMMUNITY): Payer: Self-pay | Admitting: *Deleted

## 2018-09-13 VITALS — BP 108/65 | HR 82 | Temp 98.4°F | Wt 154.0 lb

## 2018-09-13 DIAGNOSIS — Z3483 Encounter for supervision of other normal pregnancy, third trimester: Secondary | ICD-10-CM

## 2018-09-13 DIAGNOSIS — Z3480 Encounter for supervision of other normal pregnancy, unspecified trimester: Secondary | ICD-10-CM

## 2018-09-13 DIAGNOSIS — Z3A40 40 weeks gestation of pregnancy: Secondary | ICD-10-CM

## 2018-09-13 NOTE — Telephone Encounter (Signed)
Preadmission screen  

## 2018-09-13 NOTE — Progress Notes (Signed)
    PRENATAL VISIT NOTE  Subjective:  Cynthia Smith is a 23 y.o. G1P0 at [redacted]w[redacted]d being seen today for ongoing prenatal care.  She is currently monitored for the following issues for this low-risk pregnancy and has Supervision of high risk pregnancy, antepartum; Limited prenatal care in third trimester; and Rubella non-immune status, antepartum on their problem list.  Patient reports no complaints.  Contractions: Irregular. Vag. Bleeding: None.  Movement: Present. Denies leaking of fluid.   The following portions of the patient's history were reviewed and updated as appropriate: allergies, current medications, past family history, past medical history, past social history, past surgical history and problem list.   Objective:   Vitals:   09/13/18 1035  BP: 108/65  Pulse: 82  Temp: 98.4 F (36.9 C)  Weight: 154 lb (69.9 kg)    Fetal Status: Fetal Heart Rate (bpm): 135   Movement: Present     General:  Alert, oriented and cooperative. Patient is in no acute distress.  Skin: Skin is warm and dry. No rash noted.   Cardiovascular: Normal heart rate noted  Respiratory: Normal respiratory effort, no problems with respiration noted  Abdomen: Soft, gravid, appropriate for gestational age.  Pain/Pressure: Present     Pelvic: Cervical exam deferred        Extremities: Normal range of motion.  Edema: Trace  Mental Status: Normal mood and affect. Normal behavior. Normal judgment and thought content.   Assessment and Plan:  Pregnancy: G1P0 at [redacted]w[redacted]d 1. Supervision of other normal pregnancy, antepartum - routine care - IOL scheduled 7:30 am on 09/24/2018 - orders placed  - NST/BPP next week   LMP from prenatal records was uncertain, and LMP that was given in MAU was also an undertain LMP. She had an [place] in her prenatal records gave an EDC of 09/17/2018, and that was based on 9 weeks 3 days [place]. So, we will stick with this EDC as this is the more accurate EDC we have had for her.   Term labor  symptoms and general obstetric precautions including but not limited to vaginal bleeding, contractions, leaking of fluid and fetal movement were reviewed in detail with the patient. Please refer to After Visit Summary for other counseling recommendations.   Return for post dates BPP/NST needed 09/20/2018 .  Future Appointments  Date Time Provider [place]  09/20/2018 10:15 AM [facility] NST [facility] WOC  09/24/2018  7:30 AM [facility] [place] None   Marcille Buffy DNP, CNM  09/13/18  11:16 AM

## 2018-09-17 ENCOUNTER — Other Ambulatory Visit: Payer: Self-pay | Admitting: Advanced Practice Midwife

## 2018-09-17 ENCOUNTER — Telehealth: Payer: Self-pay | Admitting: Family Medicine

## 2018-09-17 NOTE — Telephone Encounter (Signed)
Spoke to patient about her appointment on 8/31 @ 10:15. Patient instructed to wear a face mask for the entire appointment and no visitors are allowed with her during the visit. Patient screened for covid symptoms and denied having any

## 2018-09-20 ENCOUNTER — Ambulatory Visit: Payer: Self-pay

## 2018-09-20 ENCOUNTER — Ambulatory Visit (INDEPENDENT_AMBULATORY_CARE_PROVIDER_SITE_OTHER): Payer: Self-pay | Admitting: *Deleted

## 2018-09-20 ENCOUNTER — Encounter (HOSPITAL_COMMUNITY): Payer: Self-pay

## 2018-09-20 ENCOUNTER — Other Ambulatory Visit: Payer: Self-pay

## 2018-09-20 VITALS — BP 103/61 | HR 87 | Wt 155.9 lb

## 2018-09-20 DIAGNOSIS — O480 Post-term pregnancy: Secondary | ICD-10-CM

## 2018-09-20 NOTE — Progress Notes (Signed)
Pt informed that the ultrasound is considered a limited OB ultrasound and is not intended to be a complete ultrasound exam.  Patient also informed that the ultrasound is not being completed with the intent of assessing for fetal or placental anomalies or any pelvic abnormalities.  Explained that the purpose of today's ultrasound is to assess for presentation, BPP and amniotic fluid volume.   Patient acknowledges the purpose of the exam and the limitations of the study.    Labor sx reviewed.

## 2018-09-22 ENCOUNTER — Other Ambulatory Visit (HOSPITAL_COMMUNITY)
Admission: RE | Admit: 2018-09-22 | Discharge: 2018-09-22 | Disposition: A | Discharge status: Home / Self Care | Payer: Self-pay | Source: Ambulatory Visit | Attending: Family Medicine | Admitting: Family Medicine

## 2018-09-22 ENCOUNTER — Other Ambulatory Visit: Payer: Self-pay

## 2018-09-22 DIAGNOSIS — Z20828 Contact with and (suspected) exposure to other viral communicable diseases: Secondary | ICD-10-CM | POA: Insufficient documentation

## 2018-09-22 DIAGNOSIS — Z01812 Encounter for preprocedural laboratory examination: Secondary | ICD-10-CM | POA: Insufficient documentation

## 2018-09-22 LAB — SARS CORONAVIRUS 2 BY RT PCR (HOSPITAL ORDER, PERFORMED IN ~~LOC~~ HOSPITAL LAB): SARS Coronavirus 2: NEGATIVE

## 2018-09-22 NOTE — MAU Note (Signed)
Covid swab collected. Pt tolerated well. PT asymptomatic 

## 2018-09-24 ENCOUNTER — Inpatient Hospital Stay (HOSPITAL_COMMUNITY)
Admission: AD | Admit: 2018-09-24 | Discharge: 2018-09-29 | DRG: 788 | Disposition: A | Discharge status: Home / Self Care | Payer: Medicaid Other | Attending: Obstetrics and Gynecology | Admitting: Obstetrics and Gynecology

## 2018-09-24 ENCOUNTER — Encounter (HOSPITAL_COMMUNITY): Payer: Self-pay

## 2018-09-24 ENCOUNTER — Inpatient Hospital Stay (HOSPITAL_COMMUNITY): Payer: Medicaid Other

## 2018-09-24 ENCOUNTER — Other Ambulatory Visit: Payer: Self-pay

## 2018-09-24 DIAGNOSIS — Z3A41 41 weeks gestation of pregnancy: Secondary | ICD-10-CM | POA: Diagnosis not present

## 2018-09-24 DIAGNOSIS — O9902 Anemia complicating childbirth: Secondary | ICD-10-CM | POA: Diagnosis present

## 2018-09-24 DIAGNOSIS — O480 Post-term pregnancy: Principal | ICD-10-CM | POA: Diagnosis present

## 2018-09-24 DIAGNOSIS — O0933 Supervision of pregnancy with insufficient antenatal care, third trimester: Secondary | ICD-10-CM

## 2018-09-24 DIAGNOSIS — Z30017 Encounter for initial prescription of implantable subdermal contraceptive: Secondary | ICD-10-CM | POA: Diagnosis not present

## 2018-09-24 DIAGNOSIS — Z23 Encounter for immunization: Secondary | ICD-10-CM

## 2018-09-24 DIAGNOSIS — Z2839 Other underimmunization status: Secondary | ICD-10-CM

## 2018-09-24 DIAGNOSIS — O0990 Supervision of high risk pregnancy, unspecified, unspecified trimester: Secondary | ICD-10-CM

## 2018-09-24 DIAGNOSIS — O9089 Other complications of the puerperium, not elsewhere classified: Secondary | ICD-10-CM | POA: Diagnosis not present

## 2018-09-24 DIAGNOSIS — Z283 Underimmunization status: Secondary | ICD-10-CM

## 2018-09-24 DIAGNOSIS — R0781 Pleurodynia: Secondary | ICD-10-CM | POA: Diagnosis not present

## 2018-09-24 DIAGNOSIS — D649 Anemia, unspecified: Secondary | ICD-10-CM | POA: Diagnosis present

## 2018-09-24 LAB — CBC
HCT: 35 % — ABNORMAL LOW (ref 36.0–46.0)
Hemoglobin: 11.5 g/dL — ABNORMAL LOW (ref 12.0–15.0)
MCH: 29.6 pg (ref 26.0–34.0)
MCHC: 32.9 g/dL (ref 30.0–36.0)
MCV: 90 fL (ref 80.0–100.0)
Platelets: 224 10*3/uL (ref 150–400)
RBC: 3.89 MIL/uL (ref 3.87–5.11)
RDW: 15.2 % (ref 11.5–15.5)
WBC: 7.1 10*3/uL (ref 4.0–10.5)
nRBC: 0 % (ref 0.0–0.2)

## 2018-09-24 LAB — TYPE AND SCREEN
ABO/RH(D): O POS
Antibody Screen: NEGATIVE

## 2018-09-24 LAB — RPR: RPR Ser Ql: NONREACTIVE

## 2018-09-24 MED ORDER — LACTATED RINGERS IV SOLN
500.0000 mL | INTRAVENOUS | Status: DC | PRN
  Administered 2018-09-25: 500 mL via INTRAVENOUS

## 2018-09-24 MED ORDER — ACETAMINOPHEN 325 MG PO TABS: 650.0000 mg | ORAL_TABLET | ORAL | Status: DC | PRN

## 2018-09-24 MED ORDER — FENTANYL-BUPIVACAINE-NACL 0.5-0.125-0.9 MG/250ML-% EP SOLN
12.0000 mL/h | EPIDURAL | Status: DC | PRN
  Administered 2018-09-25: 16:00:00 12 mL/h via EPIDURAL
  Filled 2018-09-24 (×2): qty 250

## 2018-09-24 MED ORDER — ONDANSETRON HCL 4 MG/2ML IJ SOLN: 4.0000 mg | Freq: Four times a day (QID) | INTRAMUSCULAR | Status: DC | PRN

## 2018-09-24 MED ORDER — LIDOCAINE HCL (PF) 1 % IJ SOLN: 30.0000 mL | INTRAMUSCULAR | Status: DC | PRN

## 2018-09-24 MED ORDER — DIPHENHYDRAMINE HCL 50 MG/ML IJ SOLN
12.5000 mg | INTRAMUSCULAR | Status: DC | PRN
  Administered 2018-09-25: 11:00:00 12.5 mg via INTRAVENOUS
  Filled 2018-09-24: qty 1

## 2018-09-24 MED ORDER — OXYTOCIN BOLUS FROM INFUSION: 500.0000 mL | Freq: Once | INTRAVENOUS | Status: DC

## 2018-09-24 MED ORDER — PHENYLEPHRINE 40 MCG/ML (10ML) SYRINGE FOR IV PUSH (FOR BLOOD PRESSURE SUPPORT)
80.0000 ug | PREFILLED_SYRINGE | INTRAVENOUS | Status: DC | PRN
  Administered 2018-09-25: 80 ug via INTRAVENOUS

## 2018-09-24 MED ORDER — EPHEDRINE 5 MG/ML INJ: 10.0000 mg | INTRAVENOUS | Status: DC | PRN

## 2018-09-24 MED ORDER — FENTANYL CITRATE (PF) 100 MCG/2ML IJ SOLN
100.0000 ug | INTRAMUSCULAR | Status: DC | PRN
  Administered 2018-09-24 (×2): 100 ug via INTRAVENOUS
  Filled 2018-09-24 (×2): qty 2

## 2018-09-24 MED ORDER — OXYTOCIN 40 UNITS IN NORMAL SALINE INFUSION - SIMPLE MED: 2.5000 [IU]/h | INTRAVENOUS | Status: DC

## 2018-09-24 MED ORDER — MISOPROSTOL 50MCG HALF TABLET
50.0000 ug | ORAL_TABLET | ORAL | Status: DC | PRN
  Administered 2018-09-24 (×2): 50 ug via BUCCAL
  Filled 2018-09-24 (×2): qty 1

## 2018-09-24 MED ORDER — OXYTOCIN 40 UNITS IN NORMAL SALINE INFUSION - SIMPLE MED
1.0000 m[IU]/min | INTRAVENOUS | Status: DC
  Administered 2018-09-24: 19:00:00 2 m[IU]/min via INTRAVENOUS
  Administered 2018-09-25: 20:00:00 10 m[IU]/min via INTRAVENOUS
  Filled 2018-09-24: qty 1000

## 2018-09-24 MED ORDER — SOD CITRATE-CITRIC ACID 500-334 MG/5ML PO SOLN
30.0000 mL | ORAL | Status: DC | PRN
  Filled 2018-09-24: qty 30

## 2018-09-24 MED ORDER — OXYCODONE-ACETAMINOPHEN 5-325 MG PO TABS: 1.0000 | ORAL_TABLET | ORAL | Status: DC | PRN

## 2018-09-24 MED ORDER — LACTATED RINGERS IV SOLN
INTRAVENOUS | Status: DC
  Administered 2018-09-24 – 2018-09-25 (×4): via INTRAVENOUS

## 2018-09-24 MED ORDER — LACTATED RINGERS IV SOLN: 500.0000 mL | Freq: Once | INTRAVENOUS | Status: DC

## 2018-09-24 MED ORDER — OXYCODONE-ACETAMINOPHEN 5-325 MG PO TABS: 2.0000 | ORAL_TABLET | ORAL | Status: DC | PRN

## 2018-09-24 MED ORDER — PHENYLEPHRINE 40 MCG/ML (10ML) SYRINGE FOR IV PUSH (FOR BLOOD PRESSURE SUPPORT)
80.0000 ug | PREFILLED_SYRINGE | INTRAVENOUS | Status: DC | PRN
  Filled 2018-09-24: qty 10

## 2018-09-24 MED ORDER — TERBUTALINE SULFATE 1 MG/ML IJ SOLN: 0.2500 mg | Freq: Once | INTRAMUSCULAR | Status: DC | PRN

## 2018-09-24 NOTE — Progress Notes (Signed)
Labor Progress Note Cynthia Smith is a 23 y.o. G1P0 at [redacted]w[redacted]d presented for IOL for post-dates. S: Feeling comfortable. Some mild pain cramping.   O:  BP 116/74   Pulse 89   Temp (!) 97.5 F (36.4 C) (Oral)   Resp 18   Ht 5\' 1"  (1.549 m)   Wt 70.3 kg   LMP 12/04/2017 (Approximate)   BMI 29.29 kg/m  EFM: 130, reactive, moderate variability, pos accels, no decels Ctx: q47m  CVE: Dilation: 1.5 Effacement (%): 50 Station: -2 Presentation: Vertex Exam by:: Dr. Posey Pronto    A&P: 23 y.o. G1P0 [redacted]w[redacted]d here for IOL for post-dates. #Labor: Progressing well. S/p Cytotec x2 (last given 4825). Foley bulb placed at 1400.  #Pain: IV pain meds PRN. Declines Epidural. SVE 4 hours after Cytotec/when Foley comes out or as indicated. #FWB: Cat I #GBS negative  Chauncey Mann, MD 2:28 PM

## 2018-09-24 NOTE — Progress Notes (Signed)
Cynthia Smith is a 23 y.o. G1P0 at [redacted]w[redacted]d presented for IOL for post-dates.  Subjective: Feeling more abdominal cramping now. Reports FB fell out when in the bathroom.  Objective: BP 115/75   Pulse 81   Temp 98.4 F (36.9 C) (Oral)   Resp 18   Ht 5\' 1"  (1.549 m)   Wt 70.3 kg   LMP 12/04/2017 (Approximate)   BMI 29.29 kg/m  No intake/output data recorded. No intake/output data recorded.  FHT:  FHR: 135 bpm, variability: moderate,  accelerations:  Present,  decelerations:  Absent UC:   regular, every 1.5-3 minutes SVE:   Dilation: 1.5 Effacement (%): 50 Station: -2 Exam by:: Dr. Posey Pronto   Labs: Lab Results  Component Value Date   WBC 7.1 09/24/2018   HGB 11.5 (L) 09/24/2018   HCT 35.0 (L) 09/24/2018   MCV 90.0 09/24/2018   PLT 224 09/24/2018    Assessment / Plan: Cynthia Smith is a 23 y.o. G1P0 at [redacted]w[redacted]d presented for IOL for post-dates.  Labor: Progressing normally s/p PO cyotec X 2, FB inserted at 14:00 and fell out at 16:30. Wait until 17:30 for cervical check. Potentially start pitocin at 18:00. Preeclampsia:  no signs or symptoms of toxicity Fetal Wellbeing:  Category I Pain Control:  IV pain meds-fentanyl. Pt does not want epidural  I/D:  n/a Anticipated MOD:  NSVD anticipated, c-section if maternal/fetal indication.  Lattie Haw MD PGY-1, [facility]  09/24/2018, 4:42 PM

## 2018-09-24 NOTE — H&P (Addendum)
OBSTETRIC ADMISSION HISTORY AND PHYSICAL  Cynthia Smith is a 23 y.o. female G1P0 with IUP at 4963w0d by LMP presenting for IOL for postdates.  Reports uncomplicated pregnancy. Reports good fetal movement. Denies vaginal bleeding or discharge. Denies headache, visual changes, chest pain, SOB, cough, dizziness, RUQ pain, voiding sx or change in bowel habit. Having a boy, would like circumcision-op. Planning to breast and bottle feed and would like nexplanon.   [facility]  Support person in labor: Devin  Ultrasounds . Anatomy U/S: normal  Prenatal History/Complications: Pt late to care  Rubella non-immune  Past Medical History: Past Medical History:  Diagnosis Date  . Medical history non-contributory     Past Surgical History: Past Surgical History:  Procedure Laterality Date  . NO PAST SURGERIES      Obstetrical History: OB History    Gravida  1   Para      Term      Preterm      AB      Living  0     SAB      TAB      Ectopic      Multiple      Live Births              Social History: Social History   Socioeconomic History  . Marital status: Significant Other    Spouse name: Not on file  . Number of children: Not on file  . Years of education: Not on file  . Highest education level: Not on file  Occupational History  . Not on file  Social Needs  . Financial resource strain: Not on file  . Food insecurity    Worry: Not on file    Inability: Not on file  . Transportation needs    Medical: Not on file    Non-medical: Not on file  Tobacco Use  . Smoking status: Never Smoker  . Smokeless tobacco: Never Used  Substance and Sexual Activity  . Alcohol use: Never    Frequency: Never  . Drug use: Never  . Sexual activity: Yes    Birth control/protection: None  Lifestyle  . Physical activity    Days per week: Not on file    Minutes per session: Not on file  . Stress: Not on file  Relationships  . Social  Musicianconnections    Talks on phone: Not on file    Gets together: Not on file    Attends religious service: Not on file    Active member of club or organization: Not on file    Attends meetings of clubs or organizations: Not on file    Relationship status: Not on file  Other Topics Concern  . Not on file  Social History Narrative  . Not on file    Family History: History reviewed. No pertinent family history.  Allergies: No Known Allergies  Medications Prior to Admission  Medication Sig Dispense Refill Last Dose  . cephALEXin (KEFLEX) 500 MG capsule Take 1 capsule (500 mg total) by mouth 4 (four) times daily. (Patient not taking: Reported on 08/12/2018) 28 capsule 0   . ferrous sulfate (FERROUSUL) 325 (65 FE) MG tablet Take 1 tablet (325 mg total) by mouth daily with breakfast. 30 tablet 1   . Prenatal Vit w/Fe-Methylfol-FA (PNV PO) Take by mouth.        Review of Systems  All systems reviewed and negative except as stated in HPI  Blood pressure 116/74, pulse 89, temperature (!) 97.5 F (36.4 C), temperature source Oral, resp. rate 18, height 5\' 1"  (1.549 m), weight 70.3 kg, last menstrual period 12/04/2017. General appearance: alert, cooperative and appears stated age Lungs: chest clear on ausc, no respiratory distress Heart: RRR, no rubs or gallops  Abdomen: soft, non-tender; gravid b Pelvic: as below Extremities: Homans sign is negative, no sign of DVT Presentation: cephalic Fetal monitoring: 135bpm HR, mod variability, accels present, no decels  Uterine activity: contractions 2-7 mins, duration 50-150 secs, mild, relaxed uterus  Dilation: 1.5 Effacement (%): 50 Station: -3 Exam by:: Casilda Carls RN  Prenatal labs: ABO, Rh: --/--/O POS (09/04 0810) Antibody: NEG (09/04 0810) Rubella: <0.90 (07/23 1544) RPR: Non Reactive (07/23 1544)  HBsAg: Negative (07/23 1544)  HIV: Non Reactive (07/23 1544)  GBS:   negative Glucola: late to care, 1hr GTT: 123 Genetic screening:   Late to care  Prenatal Transfer Tool  Maternal Diabetes: No Genetic Screening: Declined late to care Maternal Ultrasounds/Referrals: Normal Fetal Ultrasounds or other Referrals:  None Maternal Substance Abuse:  No Significant Maternal Medications:  Meds include: Other:  ferrous sulphate Significant Maternal Lab Results: Group B Strep negative  Results for orders placed or performed during the hospital encounter of 09/24/18 (from the past 24 hour(s))  CBC   Collection Time: 09/24/18  7:59 AM  Result Value Ref Range   WBC 7.1 4.0 - 10.5 K/uL   RBC 3.89 3.87 - 5.11 MIL/uL   Hemoglobin 11.5 (L) 12.0 - 15.0 g/dL   HCT 35.0 (L) 36.0 - 46.0 %   MCV 90.0 80.0 - 100.0 fL   MCH 29.6 26.0 - 34.0 pg   MCHC 32.9 30.0 - 36.0 g/dL   RDW 15.2 11.5 - 15.5 %   Platelets 224 150 - 400 K/uL   nRBC 0.0 0.0 - 0.2 %  Type and screen   Collection Time: 09/24/18  8:10 AM  Result Value Ref Range   ABO/RH(D) O POS    Antibody Screen NEG    Sample Expiration      09/27/2018,2359 Performed at [facility], [place] [address], [place], [place] [address]     Patient Active Problem List   Diagnosis Date Noted  . Post-dates pregnancy 09/24/2018  . Rubella non-immune status, antepartum 08/14/2018  . Supervision of high risk pregnancy, antepartum 08/12/2018  . Limited prenatal care in third trimester 08/12/2018    Assessment/Plan:  Cynthia Smith is a 23 y.o. G1P0 at [redacted]w[redacted]d here for IOL for postdates.   Labor:  S/p 2 X cytotec PO. Insertion of FB at 14:00. Will wait until FB falls out, then start pitocin 4 hrs after last cytotec. -- Pain control: IV pain medications, considering epidural in active stage of labor.   Fetal Wellbeing: -- GBS (negative) -- continuous fetal monitoring  --Circ after delivery  Postpartum Planning -- breast and bottle  -- 08/12/18 Tdap  --birth control: Nexplanon   Lattie Haw, MD PGY-1, [place]   I saw and evaluated the patient. I agree  with the findings and the plan of care as documented in the resident's note. Posterior placenta. EFW 3500g.  Barrington Ellison, MD [facility] Fellow, [facility] for [employer], [place]

## 2018-09-25 ENCOUNTER — Encounter (HOSPITAL_COMMUNITY)
Admission: AD | Disposition: A | Discharge status: Home / Self Care | Payer: Self-pay | Source: Home / Self Care | Attending: Obstetrics and Gynecology

## 2018-09-25 ENCOUNTER — Inpatient Hospital Stay (HOSPITAL_COMMUNITY): Payer: Medicaid Other | Admitting: Anesthesiology

## 2018-09-25 SURGERY — Surgical Case
Anesthesia: Epidural

## 2018-09-25 MED ORDER — SODIUM CHLORIDE (PF) 0.9 % IJ SOLN
INTRAMUSCULAR | Status: DC | PRN
  Administered 2018-09-25: 12 mL/h via EPIDURAL

## 2018-09-25 MED ORDER — ONDANSETRON HCL 4 MG/2ML IJ SOLN
INTRAMUSCULAR | Status: DC | PRN
  Administered 2018-09-25: 4 mg via INTRAVENOUS

## 2018-09-25 MED ORDER — LIDOCAINE HCL (PF) 1 % IJ SOLN
INTRAMUSCULAR | Status: DC | PRN
  Administered 2018-09-25 (×2): 4 mL via EPIDURAL

## 2018-09-25 MED ORDER — FENTANYL CITRATE (PF) 100 MCG/2ML IJ SOLN
INTRAMUSCULAR | Status: AC
  Filled 2018-09-25: qty 2

## 2018-09-25 MED ORDER — SODIUM CHLORIDE 0.9 % IV SOLN
500.0000 mg | INTRAVENOUS | Status: AC
  Administered 2018-09-25: 500 mg via INTRAVENOUS

## 2018-09-25 MED ORDER — MORPHINE SULFATE (PF) 0.5 MG/ML IJ SOLN
INTRAMUSCULAR | Status: AC
  Filled 2018-09-25: qty 10

## 2018-09-25 MED ORDER — FENTANYL CITRATE (PF) 100 MCG/2ML IJ SOLN
INTRAMUSCULAR | Status: DC | PRN
  Administered 2018-09-25: 100 ug via EPIDURAL

## 2018-09-25 MED ORDER — SODIUM CHLORIDE 0.9 % IV SOLN
INTRAVENOUS | Status: AC
  Filled 2018-09-25: qty 500

## 2018-09-25 MED ORDER — BUPIVACAINE HCL (PF) 0.25 % IJ SOLN
INTRAMUSCULAR | Status: DC | PRN
  Administered 2018-09-25 (×2): 5 mL via EPIDURAL

## 2018-09-25 MED ORDER — LACTATED RINGERS AMNIOINFUSION
INTRAVENOUS | Status: DC
  Administered 2018-09-25: 11:00:00 via INTRAUTERINE

## 2018-09-25 MED ORDER — LIDOCAINE-EPINEPHRINE (PF) 2 %-1:200000 IJ SOLN
INTRAMUSCULAR | Status: DC | PRN
  Administered 2018-09-25 (×3): 5 mg via INTRADERMAL

## 2018-09-25 MED ORDER — CEFAZOLIN SODIUM-DEXTROSE 2-4 GM/100ML-% IV SOLN
2.0000 g | INTRAVENOUS | Status: AC
  Administered 2018-09-25: 2 g via INTRAVENOUS

## 2018-09-25 MED ORDER — LACTATED RINGERS IV SOLN
INTRAVENOUS | Status: DC | PRN
  Administered 2018-09-25 – 2018-09-26 (×3): via INTRAVENOUS

## 2018-09-25 SURGICAL SUPPLY — 34 items
BENZOIN TINCTURE PRP APPL 2/3 (GAUZE/BANDAGES/DRESSINGS) ×3 IMPLANT
CANISTER SUCT 3000ML PPV (MISCELLANEOUS) ×3 IMPLANT
CHLORAPREP W/TINT 26ML (MISCELLANEOUS) ×3 IMPLANT
CLOSURE STERI STRIP 1/2 X4 (GAUZE/BANDAGES/DRESSINGS) ×3 IMPLANT
DRSG OPSITE POSTOP 4X10 (GAUZE/BANDAGES/DRESSINGS) ×3 IMPLANT
ELECT REM PT RETURN 9FT ADLT (ELECTROSURGICAL) ×3
ELECTRODE REM PT RTRN 9FT ADLT (ELECTROSURGICAL) ×1 IMPLANT
EXTRACTOR VACUUM KIWI (MISCELLANEOUS) ×3 IMPLANT
GLOVE BIOGEL PI IND STRL 7.0 (GLOVE) ×2 IMPLANT
GLOVE BIOGEL PI IND STRL 7.5 (GLOVE) ×1 IMPLANT
GLOVE BIOGEL PI INDICATOR 7.0 (GLOVE) ×4
GLOVE BIOGEL PI INDICATOR 7.5 (GLOVE) ×2
GLOVE SKINSENSE NS SZ7.0 (GLOVE) ×2
GLOVE SKINSENSE STRL SZ7.0 (GLOVE) ×1 IMPLANT
GOWN STRL REUS W/ TWL LRG LVL3 (GOWN DISPOSABLE) ×2 IMPLANT
GOWN STRL REUS W/ TWL XL LVL3 (GOWN DISPOSABLE) ×1 IMPLANT
GOWN STRL REUS W/TWL LRG LVL3 (GOWN DISPOSABLE) ×4
GOWN STRL REUS W/TWL XL LVL3 (GOWN DISPOSABLE) ×2
NS IRRIG 1000ML POUR BTL (IV SOLUTION) ×3 IMPLANT
PACK C SECTION WH (CUSTOM PROCEDURE TRAY) ×3 IMPLANT
PAD ABD 7.5X8 STRL (GAUZE/BANDAGES/DRESSINGS) ×3 IMPLANT
PAD OB MATERNITY 4.3X12.25 (PERSONAL CARE ITEMS) ×3 IMPLANT
PAD PREP 24X48 CUFFED NSTRL (MISCELLANEOUS) ×3 IMPLANT
PENCIL SMOKE EVAC W/HOLSTER (ELECTROSURGICAL) ×3 IMPLANT
STRIP CLOSURE SKIN 1/2X4 (GAUZE/BANDAGES/DRESSINGS) ×2 IMPLANT
SUT MNCRL 0 VIOLET CTX 36 (SUTURE) ×2 IMPLANT
SUT MON AB 4-0 PS1 27 (SUTURE) ×3 IMPLANT
SUT MONOCRYL 0 CTX 36 (SUTURE) ×4
SUT PLAIN 2 0 XLH (SUTURE) ×3 IMPLANT
SUT VIC AB 0 CT1 36 (SUTURE) ×6 IMPLANT
SUT VIC AB 3-0 CT1 27 (SUTURE) ×2
SUT VIC AB 3-0 CT1 TAPERPNT 27 (SUTURE) ×1 IMPLANT
TOWEL OR 17X24 6PK STRL BLUE (TOWEL DISPOSABLE) ×6 IMPLANT
WATER STERILE IRR 1000ML POUR (IV SOLUTION) ×3 IMPLANT

## 2018-09-25 NOTE — Progress Notes (Signed)
Cynthia Smith a 23 y.o.G1P0 at 79w1dadmitted forinduction of labor due toPost dates   Subjective: Sleepy, no change in sx since previous check  Objective: BP 123/79   Pulse (!) 108   Temp 98.8 F (37.1 C) (Oral)   Resp 16   Ht 5\' 1"  (1.549 m)   Wt 70.3 kg   LMP 12/04/2017 (Approximate)   BMI 29.29 kg/m  I/O last 3 completed shifts: In: -  Out: 650 [Urine:650] No intake/output data recorded.  FHT:  FHR: 150 bpm, variability: moderate,  accelerations:  Present,  decelerations:  Absent UC:   regular, every 2-7 minutes SVE:   Dilation: 7.5 Effacement (%): 90 Station: -1 Exam by:: Dr. Posey Pronto  Labs: Lab Results  Component Value Date   WBC 7.1 09/24/2018   HGB 11.5 (L) 09/24/2018   HCT 35.0 (L) 09/24/2018   MCV 90.0 09/24/2018   PLT 224 09/24/2018    Assessment / Plan: Cynthia Smith a 23 y.o.G1P0 at 30w1dadmitted forinduction of labor due toPost dates   Labor: S/p cytotec X 2, FB, on pit since 19:25 yesterday. IUPC and FSE in. Not much cervical change. Pit break between 16:00 and now. Restart Pit at 10 as per Dr Ilda Basset.  Preeclampsia:  no signs or symptoms of toxicity Fetal Wellbeing:  Category I Pain Control:  Epidural I/D:  n/a Anticipated MOD:  NSVD anticipated. Progress to c-section if pt has not progressed after 2-3 hrs of pitocin.   Cynthia Haw MD PGY-1, [facility]  09/25/2018, 8:18 PM

## 2018-09-25 NOTE — Anesthesia Procedure Notes (Signed)
Epidural Patient location during procedure: OB Start time: 09/25/2018 1:23 AM End time: 09/25/2018 1:25 AM  Staffing Anesthesiologist: Brennan Bailey, MD Performed: anesthesiologist   Preanesthetic Checklist Completed: patient identified, pre-op evaluation, timeout performed, IV checked, risks and benefits discussed and monitors and equipment checked  Epidural Patient position: sitting Prep: site prepped and draped and DuraPrep Patient monitoring: continuous pulse ox, blood pressure and heart rate Approach: midline Location: L3-L4 Injection technique: LOR air  Needle:  Needle type: Tuohy  Needle gauge: 17 G Needle length: 9 cm Catheter type: closed end flexible Catheter size: 19 Gauge Catheter at skin depth: 9 cm Test dose: negative and Other (1% lidocaine)  Assessment Events: blood not aspirated, injection not painful, no injection resistance, negative IV test and no paresthesia  Additional Notes Patient identified. Risks, benefits, and alternatives discussed with patient including but not limited to bleeding, infection, nerve damage, paralysis, failed block, incomplete pain control, headache, blood pressure changes, nausea, vomiting, reactions to medication, itching, and postpartum back pain. Confirmed with bedside nurse the patient's most recent platelet count. Confirmed with patient that they are not currently taking any anticoagulation, have any bleeding history, or any family history of bleeding disorders. Patient expressed understanding and wished to proceed. All questions were answered. Sterile technique was used throughout the entire procedure. Please see nursing notes for vital signs.   Crisp LOR on first pass. Test dose was given through epidural catheter and negative prior to continuing to dose epidural or start infusion. Warning signs of high block given to the patient including shortness of breath, tingling/numbness in hands, complete motor block, or any concerning  symptoms with instructions to call for help. Patient was given instructions on fall risk and not to get out of bed. All questions and concerns addressed with instructions to call with any issues or inadequate analgesia.  Reason for block:procedure for pain

## 2018-09-25 NOTE — Progress Notes (Signed)
Labor Progress Note Cynthia Smith is a 23 y.o. G1P0 at [redacted]w[redacted]d presented for IOL for postdates  S:  Sleeping  O:  BP 116/69   Pulse (!) 108   Temp 98.6 F (37 C) (Axillary)   Resp 16   Ht 5\' 1"  (1.549 m)   Wt 70.3 kg   LMP 12/04/2017 (Approximate)   BMI 29.29 kg/m   Fetal Tracing:  Baseline: 140 Variability: moderate Accels: 10x10 Decels: variable/early   Toco: 1-3  CVE: Dilation: 8 Effacement (%): 90(still puffy @ anterior portion) Cervical Position: Anterior Station: Plus 1, 0 Presentation: Vertex Exam by:: Haynes Bast, CNM   A&P: 23 y.o. G1P0 [redacted]w[redacted]d IOL for postdates #Labor: FHR more reassuring. MVUs still inadequate. Continue to titrate pitocin. Consider pit break if no change at next check #Pain: epidural #FWB: Cat 1 #GBS negative Wende Mott, CNM 2:02 PM

## 2018-09-25 NOTE — Progress Notes (Signed)
Setup by Jessica Gunter 10 Instruments 5 9*9 5 4*18 2 Injectables 

## 2018-09-25 NOTE — Progress Notes (Signed)
Cynthia Smith a 23 y.o.G1P0 at 18w1dadmitted forinduction of labor due toPost dates  Subjective: Feeling tender in lower left side of abdomen    Objective: BP 124/79   Pulse 99   Temp 98.7 F (37.1 C) (Oral)   Resp 16   Ht 5\' 1"  (1.549 m)   Wt 70.3 kg   LMP 12/04/2017 (Approximate)   BMI 29.29 kg/m  No intake/output data recorded. No intake/output data recorded.  FHT:  FHR: 145 bpm, variability: moderate,  accelerations:  Present,  decelerations:  Absent UC:   regular, every 1-1.5 minutes SVE:   Dilation: 8 Effacement (%): 90(still puffy @ anterior portion) Station: Plus 1, 0 Exam by:: Haynes Bast, CNM  Labs: Lab Results  Component Value Date   WBC 7.1 09/24/2018   HGB 11.5 (L) 09/24/2018   HCT 35.0 (L) 09/24/2018   MCV 90.0 09/24/2018   PLT 224 09/24/2018    Assessment / Plan: Cynthia Smith a 22 y.o.G1P0 at 4w1dadmitted forinduction of labor due toPost dates  Labor: S/p cytotec X 2, FB, on pit since 19:25 yesterday. IUPC and FSE in. Not much cervical change. Allow Pit break. Restart on pit later.  Preeclampsia:  no signs or symptoms of toxicity Fetal Wellbeing:  Category I Pain Control:  Epidural and IV pain meds I/D:  n/a Anticipated MOD:  NSVD anticipated, csection if maternal/fetal indication   Lattie Haw MD PGY-1, [facility]  09/25/2018, 4:16 PM

## 2018-09-25 NOTE — Anesthesia Preprocedure Evaluation (Signed)
Anesthesia Evaluation  Patient identified by MRN, date of birth, ID band Patient awake    Reviewed: Allergy & Precautions, Patient's Chart, lab work & pertinent test results  History of Anesthesia Complications Negative for: history of anesthetic complications  Airway Mallampati: II  TM Distance: >3 FB Neck ROM: Full    Dental no notable dental hx.    Pulmonary neg pulmonary ROS,    Pulmonary exam normal        Cardiovascular negative cardio ROS Normal cardiovascular exam     Neuro/Psych negative neurological ROS     GI/Hepatic negative GI ROS, Neg liver ROS,   Endo/Other  negative endocrine ROS  Renal/GU negative Renal ROS     Musculoskeletal negative musculoskeletal ROS (+)   Abdominal   Peds  Hematology negative hematology ROS (+)   Anesthesia Other Findings Day of surgery medications reviewed with the patient.  Reproductive/Obstetrics (+) Pregnancy                             Anesthesia Physical Anesthesia Plan  ASA: II  Anesthesia Plan: Epidural   Post-op Pain Management:    Induction:   PONV Risk Score and Plan: Treatment may vary due to age or medical condition  Airway Management Planned: Natural Airway  Additional Equipment:   Intra-op Plan:   Post-operative Plan:   Informed Consent: I have reviewed the patients History and Physical, chart, labs and discussed the procedure including the risks, benefits and alternatives for the proposed anesthesia with the patient or authorized representative who has indicated his/her understanding and acceptance.       Plan Discussed with:   Anesthesia Plan Comments:         Anesthesia Quick Evaluation  

## 2018-09-25 NOTE — Progress Notes (Signed)
LABOR PROGRESS NOTE  Cynthia Smith is a 23 y.o. G1P0 at [redacted]w[redacted]d  admitted for PD IOL.  Subjective: In significant discomfort Trying to avoid epidural, worried about lifelong back pain and it hurting the baby Does not want water broken right now  Objective: BP (!) 116/44   Pulse 74   Temp 98.8 F (37.1 C) (Oral)   Resp 18   Ht 5\' 1"  (1.549 m)   Wt 70.3 kg   LMP 12/04/2017 (Approximate)   BMI 29.29 kg/m  or  Vitals:   09/24/18 1542 09/24/18 1928 09/24/18 2144 09/24/18 2258  BP: 115/75 113/79 127/81 (!) 116/44  Pulse: 81 (!) 101 (!) 111 74  Resp: 18 18 18 18   Temp:   98.8 F (37.1 C)   TempSrc:   Oral   Weight:      Height:         Dilation: 6 Effacement (%): 100 Cervical Position: Middle Station: -1 Presentation: Vertex Exam by:: Craig Guess, RN FHT: baseline rate 145, moderate varibility, +acel, variable decel Toco: q2-3 min  Labs: Lab Results  Component Value Date   WBC 7.1 09/24/2018   HGB 11.5 (L) 09/24/2018   HCT 35.0 (L) 09/24/2018   MCV 90.0 09/24/2018   PLT 224 09/24/2018    Patient Active Problem List   Diagnosis Date Noted  . Post-dates pregnancy 09/24/2018  . Rubella non-immune status, antepartum 08/14/2018  . Supervision of high risk pregnancy, antepartum 08/12/2018  . Limited prenatal care in third trimester 08/12/2018    Assessment / Plan: 23 y.o. G1P0 at [redacted]w[redacted]d here for PD IOL.  Labor: S/p miso x2, FB, on pit since 1924. Has been 6cm since 1900, recommended AROM to help progress labor but wants to wait at present, acceptable in setting of reassuring FHT. If elects for epidural will plan to discuss AROM again afterwards.  Fetal Wellbeing:  Cat II for variable decel however overall reassuring with normal baseline, mod var, and +accel. Pain Control:  Very hesitant regarding epidural, discussed at length as she appears to be in great pain and is advancing slowly. Anesthesia to see patient and discuss futher.  Anticipated MOD:  SVD  Augustin Coupe, MD/[facility] OB Fellow  09/25/2018, 1:16 AM

## 2018-09-25 NOTE — Progress Notes (Signed)
OB Note SVE unchanged per Dr. Dione Plover. Fetus category I and UCs q2-87m with pitocin at 2mU D/w pt and partner recommend c/s for arrest of dilation, has been ruptured since yesterday night and on pitocin that entire time with short pitocin break in late afternoon today. D/w them arrest of dilation and risk of infection, bleeding.  Patient and partner would like time to talk about situation and after a few minutes they wanted some more time.  Continue pitocin and if decides she is amenable to c-section then d/c pitocin. If they want to continue with labor, then continue pitocin and reassess q26m. RN to let me know what patient and partner decides.   Durene Romans MD Attending Center for [employer] (Faculty Practice) 09/25/2018 Time: 2250

## 2018-09-25 NOTE — Progress Notes (Signed)
Agree with Dr. Allen Derry note. Recheck SVE approx 2 hours after restarting pitocin with new IUPC.   Durene Romans MD Attending [facility] for [employer] (Faculty Practice) 09/25/2018

## 2018-09-25 NOTE — Progress Notes (Signed)
LABOR PROGRESS NOTE  Cynthia Smith is a 23 y.o. G1P0 at [redacted]w[redacted]d  admitted for PD IOL.  Subjective: Epidural working well but hasn't been able to get much rest  Objective: BP 106/67   Pulse (!) 123   Temp 99.8 F (37.7 C) (Oral)   Resp 16   Ht 5\' 1"  (1.549 m)   Wt 70.3 kg   LMP 12/04/2017 (Approximate)   BMI 29.29 kg/m  or  Vitals:   09/25/18 2000 09/25/18 2027 09/25/18 2031 09/25/18 2101  BP: 123/79 109/67 108/68 106/67  Pulse: (!) 108 (!) 118 (!) 110 (!) 123  Resp:      Temp:  99.8 F (37.7 C)    TempSrc:  Oral    Weight:      Height:         Dilation: 8 Effacement (%): 90 Cervical Position: Anterior Station: -1 Presentation: Vertex Exam by:: Dr. Dione Plover FHT: baseline rate 150, moderate varibility, +acel, -decel Toco: q5-6 min, not adequate  Labs: Lab Results  Component Value Date   WBC 7.1 09/24/2018   HGB 11.5 (L) 09/24/2018   HCT 35.0 (L) 09/24/2018   MCV 90.0 09/24/2018   PLT 224 09/24/2018    Patient Active Problem List   Diagnosis Date Noted  . Post-dates pregnancy 09/24/2018  . Rubella non-immune status, antepartum 08/14/2018  . Supervision of high risk pregnancy, antepartum 08/12/2018  . Limited prenatal care in third trimester 08/12/2018    Assessment / Plan: 23 y.o. G1P0 at [redacted]w[redacted]d here for PD IOL.  Labor:  Slow progress over course of the day, has been on pit break since 1600. IUPC replaced at 2030 and will restart pit at 10, plan to recheck in 2-3 hours and assess progress.  Fetal Wellbeing:  Cat I Pain Control:  epidural Anticipated MOD:  SVD, but conversation opened regarding cesarean should we not achieve further cervical progress  Augustin Coupe, MD/[facility] OB Fellow  09/25/2018, 9:13 PM

## 2018-09-25 NOTE — Progress Notes (Signed)
LABOR PROGRESS NOTE  Cynthia Smith is a 23 y.o. G1P0 at [redacted]w[redacted]d  admitted for PD IOL.  Subjective: Feeling much better after epidural  Objective: BP (!) 101/58   Pulse 99   Temp 98.2 F (36.8 C) (Oral)   Resp 18   Ht 5\' 1"  (1.549 m)   Wt 70.3 kg   LMP 12/04/2017 (Approximate)   BMI 29.29 kg/m  or  Vitals:   09/25/18 0201 09/25/18 0206 09/25/18 0216 09/25/18 0231  BP: (!) 110/57 107/61 116/63 (!) 101/58  Pulse: 100 94 (!) 107 99  Resp: 18 18 18 18   Temp:      TempSrc:      Weight:      Height:         Dilation: 6 Effacement (%): 100 Cervical Position: Middle Station: -1 Presentation: Vertex Exam by:: Craig Guess, RN FHT: baseline rate 135, moderate varibility, +acel, variable decel Toco: q2-3 min  Labs: Lab Results  Component Value Date   WBC 7.1 09/24/2018   HGB 11.5 (L) 09/24/2018   HCT 35.0 (L) 09/24/2018   MCV 90.0 09/24/2018   PLT 224 09/24/2018    Patient Active Problem List   Diagnosis Date Noted  . Post-dates pregnancy 09/24/2018  . Rubella non-immune status, antepartum 08/14/2018  . Supervision of high risk pregnancy, antepartum 08/12/2018  . Limited prenatal care in third trimester 08/12/2018    Assessment / Plan: 23 y.o. G1P0 at [redacted]w[redacted]d here for PD IOL.  Labor: S/p miso x2, FB, on pit since 1924. AROM now w clear fluid, uptitrate pitocin PRN.   Fetal Wellbeing:  Cat II for variable decel however overall reassuring with normal baseline, mod var, and +accel. Pain Control:  epidural Anticipated MOD:  SVD  Augustin Coupe, MD/MPH OB Fellow  09/25/2018, 2:33 AM

## 2018-09-25 NOTE — Progress Notes (Signed)
Cynthia Smith is a 23 y.o. G1P0 at [redacted]w[redacted]d admitted for induction of labor due to Post dates.  Subjective: Feeling ok with epidural.  Objective: BP (!) 113/59   Pulse (!) 104   Temp 98.2 F (36.8 C) (Oral)   Resp 18   Ht 5\' 1"  (1.549 m)   Wt 70.3 kg   LMP 12/04/2017 (Approximate)   BMI 29.29 kg/m  No intake/output data recorded. No intake/output data recorded.  FHT: reassuring UC:  q 2-3 minutes SVE:   8/100/-1  Labs: Lab Results  Component Value Date   WBC 7.1 09/24/2018   HGB 11.5 (L) 09/24/2018   HCT 35.0 (L) 09/24/2018   MCV 90.0 09/24/2018   PLT 224 09/24/2018    Assessment / Plan: IOL- progresing Expect SVE  Cynthia Smith 09/25/2018, 6:03 AM

## 2018-09-25 NOTE — Progress Notes (Signed)
Fatin Milli Woolridge is a 23 y.o. G1P0 at [redacted]w[redacted]d admitted for induction of labor due to Post dates  Subjective: Comfortable post epidural. Midwife Maryruth Hancock explained we may insert IUPC in to assess how strong uterine contractions are. Pt understood and agreed to this.   Objective: BP (!) 97/56   Pulse (!) 103   Temp 98.3 F (36.8 C) (Oral)   Resp 18   Ht 5\' 1"  (1.549 m)   Wt 70.3 kg   LMP 12/04/2017 (Approximate)   BMI 29.29 kg/m  No intake/output data recorded. No intake/output data recorded.  FHT:  FHR: 130 bpm, variability: moderate,  accelerations:  Present,  decelerations:  Present variable UC:   regular, every 2-6 minutes SVE:   Dilation: 8.5 Effacement (%): (puffy anterior portion of cervix) Station: -1 Exam by:: A. Tuttle, RN  Labs: Lab Results  Component Value Date   WBC 7.1 09/24/2018   HGB 11.5 (L) 09/24/2018   HCT 35.0 (L) 09/24/2018   MCV 90.0 09/24/2018   PLT 224 09/24/2018    Assessment / Plan: Jadzia Ibsen is a 23 y.o. G1P0 at [redacted]w[redacted]d admitted for induction of labor due to Post dates  Labor: Progressing normally s/p cytotec X 2, FB, on pit since 19:25 yesteday. Prolonged decels. Explained that we should put an IUPC in to assess how strong contractions are. Pt agreed.  Preeclampsia:  no signs or symptoms of toxicity Fetal Wellbeing:  Category II monitoring fetal status regularly  Pain Control:  Epidural I/D:  n/a GSB negative  Anticipated MOD:  NSVD anticipated. Progress to csection if maternal/fetal indication   Lattie Haw MD PGY-1, [facility]  09/25/2018, 9:08 AM

## 2018-09-25 NOTE — Progress Notes (Signed)
Cynthia Smith a 23 y.o.G1P0 at 55w1dadmitted forinduction of labor due toPost dates  Subjective: Feeling sleepy. Wants ice pack for right arm as hurting due to BP cuff. Explained that we would insert IUPC to check how strong contractions are and fetal scalp electrode to measure fetal HR. Pt agreed.   Objective: BP (!) 97/56   Pulse (!) 103   Temp 98.1 F (36.7 C) (Oral)   Resp 18   Ht 5\' 1"  (1.549 m)   Wt 70.3 kg   LMP 12/04/2017 (Approximate)   BMI 29.29 kg/m  No intake/output data recorded. No intake/output data recorded.  FHT:  FHR: 145 bpm, variability: moderate,  accelerations:  Present,  decelerations:  Present variable decels  UC:   regular, every 2-6 minutes SVE:   Dilation: 7 Effacement (%): 80 Station: -1 Exam by:: Haynes Bast CNM  Labs: Lab Results  Component Value Date   WBC 7.1 09/24/2018   HGB 11.5 (L) 09/24/2018   HCT 35.0 (L) 09/24/2018   MCV 90.0 09/24/2018   PLT 224 09/24/2018    Assessment / Plan: Cynthia Smith a 22 y.o.G1P0 at 34w1dadmitted forinduction of labor due toPost dates  Labor: Progressing normally Progressing normally s/p cytotec X 2, FB, on pit since 19:25 yesteday. Variable decels. IUPC and FSE inserted by Len Blalock.  Preeclampsia:  no signs or symptoms of toxicity Fetal Wellbeing:  Category II Pain Control:  IV pain meds I/D:  n/a Anticipated MOD:  NSVD anticipated, c-section if maternal/fetal indication  Lattie Haw MD PGY-1, [facility]  09/25/2018, 11:01 AM

## 2018-09-26 ENCOUNTER — Encounter (HOSPITAL_COMMUNITY): Payer: Self-pay

## 2018-09-26 DIAGNOSIS — O480 Post-term pregnancy: Secondary | ICD-10-CM

## 2018-09-26 LAB — CBC
HCT: 26.5 % — ABNORMAL LOW (ref 36.0–46.0)
Hemoglobin: 8.8 g/dL — ABNORMAL LOW (ref 12.0–15.0)
MCH: 29.8 pg (ref 26.0–34.0)
MCHC: 33.2 g/dL (ref 30.0–36.0)
MCV: 89.8 fL (ref 80.0–100.0)
Platelets: 155 10*3/uL (ref 150–400)
RBC: 2.95 MIL/uL — ABNORMAL LOW (ref 3.87–5.11)
RDW: 15.5 % (ref 11.5–15.5)
WBC: 16 10*3/uL — ABNORMAL HIGH (ref 4.0–10.5)
nRBC: 0 % (ref 0.0–0.2)

## 2018-09-26 MED ORDER — SENNOSIDES-DOCUSATE SODIUM 8.6-50 MG PO TABS
2.0000 | ORAL_TABLET | ORAL | Status: DC
  Administered 2018-09-28: 23:00:00 2 via ORAL
  Filled 2018-09-26: qty 2

## 2018-09-26 MED ORDER — NALBUPHINE HCL 10 MG/ML IJ SOLN
5.0000 mg | INTRAMUSCULAR | Status: DC | PRN
  Filled 2018-09-26: qty 0.5

## 2018-09-26 MED ORDER — CEFAZOLIN SODIUM-DEXTROSE 2-4 GM/100ML-% IV SOLN
INTRAVENOUS | Status: AC
  Filled 2018-09-26: qty 100

## 2018-09-26 MED ORDER — MEPERIDINE HCL 25 MG/ML IJ SOLN: 6.2500 mg | INTRAMUSCULAR | Status: DC | PRN

## 2018-09-26 MED ORDER — DIPHENHYDRAMINE HCL 25 MG PO CAPS: 25.0000 mg | ORAL_CAPSULE | ORAL | Status: DC | PRN

## 2018-09-26 MED ORDER — FENTANYL CITRATE (PF) 100 MCG/2ML IJ SOLN: 25.0000 ug | INTRAMUSCULAR | Status: DC | PRN

## 2018-09-26 MED ORDER — SODIUM CHLORIDE 0.9 % IV SOLN
INTRAVENOUS | Status: DC | PRN
  Administered 2018-09-26: 40 [IU] via INTRAVENOUS

## 2018-09-26 MED ORDER — IBUPROFEN 800 MG PO TABS
800.0000 mg | ORAL_TABLET | Freq: Four times a day (QID) | ORAL | Status: DC
  Administered 2018-09-27 – 2018-09-29 (×10): 800 mg via ORAL
  Filled 2018-09-26 (×10): qty 1

## 2018-09-26 MED ORDER — OXYTOCIN 40 UNITS IN NORMAL SALINE INFUSION - SIMPLE MED
INTRAVENOUS | Status: AC
  Filled 2018-09-26: qty 1000

## 2018-09-26 MED ORDER — SIMETHICONE 80 MG PO CHEW: 80.0000 mg | CHEWABLE_TABLET | ORAL | Status: DC | PRN

## 2018-09-26 MED ORDER — DIPHENHYDRAMINE HCL 25 MG PO CAPS: 25.0000 mg | ORAL_CAPSULE | Freq: Four times a day (QID) | ORAL | Status: DC | PRN

## 2018-09-26 MED ORDER — DIBUCAINE (PERIANAL) 1 % EX OINT: 1.0000 "application " | TOPICAL_OINTMENT | CUTANEOUS | Status: DC | PRN

## 2018-09-26 MED ORDER — COCONUT OIL OIL: 1.0000 "application " | TOPICAL_OIL | Status: DC | PRN

## 2018-09-26 MED ORDER — OXYTOCIN 40 UNITS IN NORMAL SALINE INFUSION - SIMPLE MED: 2.5000 [IU]/h | INTRAVENOUS | Status: AC

## 2018-09-26 MED ORDER — NALBUPHINE HCL 10 MG/ML IJ SOLN
5.0000 mg | Freq: Once | INTRAMUSCULAR | Status: DC | PRN
  Filled 2018-09-26: qty 0.5

## 2018-09-26 MED ORDER — ENOXAPARIN SODIUM 40 MG/0.4ML ~~LOC~~ SOLN
40.0000 mg | SUBCUTANEOUS | Status: DC
  Administered 2018-09-27 – 2018-09-28 (×2): 40 mg via SUBCUTANEOUS
  Filled 2018-09-26 (×2): qty 0.4

## 2018-09-26 MED ORDER — NALOXONE HCL 0.4 MG/ML IJ SOLN: 0.4000 mg | INTRAMUSCULAR | Status: DC | PRN

## 2018-09-26 MED ORDER — DEXAMETHASONE SODIUM PHOSPHATE 4 MG/ML IJ SOLN
INTRAMUSCULAR | Status: AC
  Filled 2018-09-26: qty 1

## 2018-09-26 MED ORDER — SIMETHICONE 80 MG PO CHEW
80.0000 mg | CHEWABLE_TABLET | ORAL | Status: DC
  Administered 2018-09-26 – 2018-09-28 (×3): 80 mg via ORAL
  Filled 2018-09-26 (×3): qty 1

## 2018-09-26 MED ORDER — PRENATAL MULTIVITAMIN CH
1.0000 | ORAL_TABLET | Freq: Every day | ORAL | Status: DC
  Administered 2018-09-26 – 2018-09-29 (×4): 1 via ORAL
  Filled 2018-09-26 (×4): qty 1

## 2018-09-26 MED ORDER — ONDANSETRON HCL 4 MG/2ML IJ SOLN: 4.0000 mg | Freq: Once | INTRAMUSCULAR | Status: DC | PRN

## 2018-09-26 MED ORDER — WITCH HAZEL-GLYCERIN EX PADS: 1.0000 "application " | MEDICATED_PAD | CUTANEOUS | Status: DC | PRN

## 2018-09-26 MED ORDER — OXYCODONE HCL 5 MG/5ML PO SOLN: 5.0000 mg | Freq: Once | ORAL | Status: DC | PRN

## 2018-09-26 MED ORDER — MORPHINE SULFATE (PF) 0.5 MG/ML IJ SOLN
INTRAMUSCULAR | Status: DC | PRN
  Administered 2018-09-26: 1 mg via INTRAVENOUS
  Administered 2018-09-26: 3 mg via EPIDURAL

## 2018-09-26 MED ORDER — ONDANSETRON HCL 4 MG/2ML IJ SOLN: 4.0000 mg | Freq: Three times a day (TID) | INTRAMUSCULAR | Status: DC | PRN

## 2018-09-26 MED ORDER — LACTATED RINGERS IV SOLN
INTRAVENOUS | Status: DC
  Administered 2018-09-26: 09:00:00 via INTRAVENOUS

## 2018-09-26 MED ORDER — NALOXONE HCL 4 MG/10ML IJ SOLN
1.0000 ug/kg/h | INTRAVENOUS | Status: DC | PRN
  Filled 2018-09-26: qty 5

## 2018-09-26 MED ORDER — PHENYLEPHRINE HCL (PRESSORS) 10 MG/ML IV SOLN
INTRAVENOUS | Status: DC | PRN
  Administered 2018-09-25 (×2): 80 ug via INTRAVENOUS
  Administered 2018-09-26: 160 ug via INTRAVENOUS
  Administered 2018-09-26 (×10): 80 ug via INTRAVENOUS

## 2018-09-26 MED ORDER — SODIUM CHLORIDE 0.9% FLUSH: 3.0000 mL | INTRAVENOUS | Status: DC | PRN

## 2018-09-26 MED ORDER — KETOROLAC TROMETHAMINE 30 MG/ML IJ SOLN: 30.0000 mg | Freq: Once | INTRAMUSCULAR | Status: DC | PRN

## 2018-09-26 MED ORDER — MENTHOL 3 MG MT LOZG: 1.0000 | LOZENGE | OROMUCOSAL | Status: DC | PRN

## 2018-09-26 MED ORDER — OXYCODONE HCL 5 MG PO TABS: 5.0000 mg | ORAL_TABLET | Freq: Once | ORAL | Status: DC | PRN

## 2018-09-26 MED ORDER — DIPHENHYDRAMINE HCL 50 MG/ML IJ SOLN: 12.5000 mg | INTRAMUSCULAR | Status: DC | PRN

## 2018-09-26 MED ORDER — PHENYLEPHRINE 40 MCG/ML (10ML) SYRINGE FOR IV PUSH (FOR BLOOD PRESSURE SUPPORT)
PREFILLED_SYRINGE | INTRAVENOUS | Status: AC
  Filled 2018-09-26: qty 20

## 2018-09-26 MED ORDER — LACTATED RINGERS IV SOLN: 500.0000 mL | Freq: Once | INTRAVENOUS | Status: DC

## 2018-09-26 MED ORDER — SODIUM CHLORIDE 0.9 % IV SOLN
INTRAVENOUS | Status: DC | PRN
  Administered 2018-09-26: via INTRAVENOUS

## 2018-09-26 MED ORDER — ONDANSETRON HCL 4 MG/2ML IJ SOLN
INTRAMUSCULAR | Status: AC
  Filled 2018-09-26: qty 2

## 2018-09-26 MED ORDER — KETOROLAC TROMETHAMINE 30 MG/ML IJ SOLN
30.0000 mg | Freq: Four times a day (QID) | INTRAMUSCULAR | Status: AC
  Administered 2018-09-26 (×4): 30 mg via INTRAVENOUS
  Filled 2018-09-26 (×4): qty 1

## 2018-09-26 MED ORDER — SIMETHICONE 80 MG PO CHEW
80.0000 mg | CHEWABLE_TABLET | Freq: Three times a day (TID) | ORAL | Status: DC
  Administered 2018-09-26 – 2018-09-29 (×10): 80 mg via ORAL
  Filled 2018-09-26 (×10): qty 1

## 2018-09-26 MED ORDER — SCOPOLAMINE 1 MG/3DAYS TD PT72: 1.0000 | MEDICATED_PATCH | Freq: Once | TRANSDERMAL | Status: DC

## 2018-09-26 MED ORDER — HYDROCODONE-ACETAMINOPHEN 5-325 MG PO TABS
1.0000 | ORAL_TABLET | ORAL | Status: DC | PRN
  Administered 2018-09-27 (×3): 1 via ORAL
  Administered 2018-09-28: 2 via ORAL
  Filled 2018-09-26: qty 1
  Filled 2018-09-26: qty 2
  Filled 2018-09-26 (×3): qty 1

## 2018-09-26 NOTE — Op Note (Addendum)
Operative Note   SURGERY DATE: 09/26/2018  PRE-OP DIAGNOSIS:  *Pregnancy at 41 weeks 1 day *Arrest of dilation at 8 cm *Failed post-dates induction  POST-OP DIAGNOSIS: same   PROCEDURE: primary low transverse cesarean section via pfannenstiel skin incision with double layer uterine closure  SURGEON: Surgeon(s) and Role:    Whitwell Bing* Shamicka Inga, MD - Primary    * Venora MaplesEckstat, Matthew M, MD - Fellow  ASSISTANT: none  ANESTHESIA: epidural  ESTIMATED BLOOD LOSS: 421 cc  DRAINS: 600mL UOP via indwelling foley  TOTAL IV FLUIDS: 3500 mL crystalloid  VTE PROPHYLAXIS: SCDs to bilateral lower extremities  ANTIBIOTICS: Two grams of Cefazolin were given., within 1 hour of skin incision, she was also given 500mg  Azithromycin  SPECIMENS: placenta to L&D  COMPLICATIONS: none  INDICATIONS: Arrest of dilation  FINDINGS: No intra-abdominal adhesions were noted. Grossly normal uterus, tubes and ovaries. Clear amniotic fluid, cephalic female infant, weight 3695gm, APGARs 4/9, intact placenta.  PROCEDURE IN DETAIL: The patient was taken to the operating room where anesthesia was administered and normal fetal heart tones were confirmed. She was then prepped and draped in the normal fashion in the dorsal supine position with a leftward tilt.  After a time out was performed, a pfannensteil skin incision was made with the scalpel and carried through to the underlying layer of fascia with bovie. The fascia was then incised at the midline and this incision was extended laterally with the mayo scissors. Attention was turned to the superior aspect of the fascial incision which was grasped with the kocher clamps x 2, tented up and the rectus muscles were dissected off the fascia with mayo scissors. In a similar fashion the inferior aspect of the fascial incision was grasped with the kocher clamps, tented up and the rectus muscles dissected off with the mayo scissors. The rectus muscles were then separated in the  midline and the peritoneum was entered bluntly. The bladder blade was inserted and the vesicouterine peritoneum was identified, tented up and entered with the metzenbaum scissors. This incision was extended laterally and the bladder flap was created digitally. The bladder blade was reinserted.  A low transverse hysterotomy was made with the scalpel until the endometrial cavity was breached and the amniotic sac ruptured spontaneously, yielding clear amniotic fluid. This incision was extended bluntly and the infant's head, shoulders and body were delivered atraumatically.The cord was clamped x 2 and cut, and the infant was handed to the awaiting pediatricians without delayed cord clamping due to poor infant tone and lack of cry.  The placenta was then gradually expressed from the uterus and then the uterus was exteriorized and cleared of all clots and debris. The hysterotomy was repaired with a running suture of 0 Monocryl. A second imbricating layer of 0 Monocryl suture was then placed. At this point the closure was inspected and excellent hemostasis was noted.   The uterus and adnexa were then returned to the abdomen, and the hysterotomy and all operative sites were reinspected and excellent hemostasis was noted after irrigation and suction of the abdomen with warm saline.  The peritoneum was closed with a running stitch of 3-0 Vicryl. The fascia was reapproximated with 0 Vicryl in a simple running fashion bilaterally. The subcutaneous layer was then reapproximated with interrupted sutures of 2-0 plain gut, and the skin was then closed with 4-0 monocryl, in a subcuticular fashion.  The patient  tolerated the procedure well. Sponge, lap, needle, and instrument counts were correct x 2. The patient was  transferred to the recovery room awake, alert and breathing independently in stable condition.  Augustin Coupe, MD/MPH Industry for [employer] [facility])  Agree with above. I was  present and scrubbed for the entire procedure.   Durene Romans MD Attending Center for [employer])

## 2018-09-26 NOTE — Anesthesia Postprocedure Evaluation (Signed)
Anesthesia Post Note  Patient: Cynthia Smith  Procedure(s) Performed: CESAREAN SECTION (N/A )     Patient location during evaluation: Mother Baby Anesthesia Type: Epidural Level of consciousness: awake Pain management: satisfactory to patient Vital Signs Assessment: post-procedure vital signs reviewed and stable Respiratory status: spontaneous breathing Cardiovascular status: stable Anesthetic complications: no    Last Vitals:  Vitals:   09/26/18 0300 09/26/18 0400  BP: 125/82 124/78  Pulse: 96 100  Resp: 18 18  Temp: 37.1 C 37 C  SpO2: 96% 95%    Last Pain:  Vitals:   09/26/18 0530  TempSrc:   PainSc: 0-No pain   Pain Goal:                   [employer]

## 2018-09-26 NOTE — Addendum Note (Signed)
Addendum  created 09/26/18 0754 by Asher Muir, CRNA   Charge Capture section accepted, Clinical Note Signed

## 2018-09-26 NOTE — Transfer of Care (Signed)
Immediate Anesthesia Transfer of Care Note  Patient: Cynthia Smith  Procedure(s) Performed: CESAREAN SECTION (N/A )  Patient Location: PACU  Anesthesia Type:Epidural  Level of Consciousness: awake, alert  and oriented  Airway & Oxygen Therapy: Patient Spontanous Breathing and Patient connected to nasal cannula oxygen  Post-op Assessment: Report given to RN and Post -op Vital signs reviewed and stable  Post vital signs: Reviewed and stable  Last Vitals:  Vitals Value Taken Time  BP 124/84 09/26/18 0115  Temp    Pulse 138 09/26/18 0117  Resp 33 09/26/18 0117  SpO2 98 % 09/26/18 0117  Vitals shown include unvalidated device data.  Last Pain:  Vitals:   09/25/18 2201  TempSrc: Oral  PainSc: 6          Complications: No apparent anesthesia complications

## 2018-09-26 NOTE — Anesthesia Postprocedure Evaluation (Signed)
Anesthesia Post Note  Patient: Cynthia Smith  Procedure(s) Performed: CESAREAN SECTION (N/A )     Patient location during evaluation: PACU Anesthesia Type: Epidural Level of consciousness: oriented and awake and alert Pain management: pain level controlled Vital Signs Assessment: post-procedure vital signs reviewed and stable Respiratory status: spontaneous breathing, respiratory function stable and nonlabored ventilation Cardiovascular status: blood pressure returned to baseline and stable Postop Assessment: no headache, no backache, no apparent nausea or vomiting and epidural receding Anesthetic complications: no    Last Vitals:  Vitals:   09/26/18 0300 09/26/18 0400  BP: 125/82 124/78  Pulse: 96 100  Resp: 18 18  Temp: 37.1 C 37 C  SpO2: 96% 95%    Last Pain:  Vitals:   09/26/18 0400  TempSrc: Oral  PainSc: 0-No pain   Pain Goal:                Epidural/Spinal Function Cutaneous sensation: Normal sensation (09/26/18 0400)  Lidia Collum

## 2018-09-26 NOTE — Discharge Summary (Deleted)
Postpartum Discharge Summary     Patient Name: Cynthia Smith DOB: 11-13-1995 MRN: [id number]  Date of admission: 09/24/2018 Delivering Provider: Aletha Halim   Date of discharge: 09/28/2018  Admitting diagnosis: pregnancy Intrauterine pregnancy: [redacted]w[redacted]d    Secondary diagnosis:  Active Problems:   Post-dates pregnancy  Additional problems:  Primary Cesarean Section Failed induction of labor Arrest of dilation     Discharge diagnosis: Term Pregnancy Delivered                                                                                                Post partum procedures:n/a  Augmentation: AROM, Pitocin, Cytotec and Foley Balloon  Complications: None  Hospital course:  Induction of Labor With Cesarean Section  23y.o. yo G1P0 at 425w2das admitted to the hospital 09/24/2018 for induction of labor. Patient had a labor course significant for Cytotec x2, foley balloon placement, AROM and pitocin augmentation. Despite prolonged period of pitocin augmentation patient failed to dilate beyond 8 cm and was recommended for cesarean. The patient went for cesarean section due to Arrest of Dilation, and delivered a Viable infant,09/26/2018  Membrane Rupture Time/Date: 2:16 AM ,09/25/2018   Details of operation can be found in separate operative Note.  Patient had an uncomplicated postpartum course. Had some retrosternal, pleuritic chest pain. 2/10 severity. No radiation. Not reproducible on palpation. No tachycardia or hypoxia-vital signs remained normal. She is ambulating, tolerating a regular diet, passing flatus, and urinating well.  Patient is discharged home in stable condition on 09/28/18.                                   Delivery time: 12:18 AM    Magnesium Sulfate received: No BMZ received: No Rhophylac:N/A MMR:Yes Transfusion:No  Physical exam  Vitals:   09/26/18 2045 09/27/18 0510 09/27/18 2124 09/28/18 0520  BP: 100/65 (!) 108/59 114/67 113/72  Pulse: (!) 112 87 68 66   Resp: 18 18 17 18   Temp: 97.8 F (36.6 C) 98.2 F (36.8 C) 98.7 F (37.1 C) 98.2 F (36.8 C)  TempSrc: Oral Oral Oral Oral  SpO2: 97% 98%  100%  Weight:      Height:       General: alert, cooperative and no distress  Cardiac: RRR, no rubs or gallops  Pulm: Chest clear on ausc, no crackles or wheeze, no respiratory distress GI: Abdo soft, mildly tender in lower abdomen. Bowel sounds present  Lochia: appropriate Uterine Fundus: firm Incision: Dressing is clean, dry, and intact DVT Evaluation: No cords or calf tenderness. No significant calf/ankle edema. Labs: Lab Results  Component Value Date   WBC 16.0 (H) 09/26/2018   HGB 8.8 (L) 09/26/2018   HCT 26.5 (L) 09/26/2018   MCV 89.8 09/26/2018   PLT 155 09/26/2018   No flowsheet data found.  Discharge instruction: per After Visit Summary and "Baby and Me Booklet".  After visit meds:  Allergies as of 09/28/2018   No Known Allergies     Medication List    STOP taking  these medications   acetaminophen 500 MG tablet Commonly known as: TYLENOL   cephALEXin 500 MG capsule Commonly known as: KEFLEX   ferrous sulfate 325 (65 FE) MG tablet Commonly known as: FerrouSul   magnesium citrate Soln   PNV PO     TAKE these medications   Ferrous Fumarate 324 (106 Fe) MG Tabs tablet Commonly known as: HEMOCYTE - 106 mg FE Take 1 tablet (106 mg of iron total) by mouth 2 (two) times daily.   ibuprofen 800 MG tablet Commonly known as: ADVIL Take 1 tablet (800 mg total) by mouth every 6 (six) hours.   prenatal multivitamin Tabs tablet Take 1 tablet by mouth daily at 12 noon.   senna-docusate 8.6-50 MG tablet Commonly known as: Senokot-S Take 2 tablets by mouth daily. Start taking on: September 29, 2018       Diet: routine diet  Activity: Advance as tolerated. Pelvic rest for 6 weeks.   Outpatient follow up:2 weeks Follow up Appt:No future appointments. Follow up Visit:  Please schedule this patient for Postpartum  visit in: 2 weeks with the following provider: MD For C/S patients schedule nurse incision check in weeks 2 weeks: yes Low risk pregnancy complicated by: n/a Delivery mode:  CS Anticipated Birth Control:  Nexplanon PP Procedures needed: Incision check  Schedule Integrated BH visit: no    Newborn Data: Live born female  Birth Weight: 3695g  APGAR: 33, 9  Newborn Delivery   Birth date/time: 09/26/2018 00:18:00 Delivery type: C-Section, Low Transverse Trial of labor: Yes C-section categorization: Primary      Baby Feeding: Bottle and Breast Disposition:home with mother   09/28/2018 Lattie Haw, MD

## 2018-09-26 NOTE — Lactation Note (Signed)
This note was copied from a baby's chart. Lactation Consultation Note  Patient Name: Cynthia Smith Today's Date: 09/26/2018 Reason for consult: Initial assessment;1st time breastfeeding P1, 5 hour female infant. Per mom, infant breastfed three times since delivery , 31 minutes in L&D, twice in room for 22 minutes and then 10 minutes prior to [facility] entering the room. Mom feels breastfeeding is going well. Mom doesn't have a breast pump at home, [facility] gave mom a hand pump and mom was shown how to use hand pump & how to disassemble, clean, & reassemble parts. Mom knows to breastfeed infant according hunger cues, 8 to 12 times within 24 hours and on demand. Reviewed Baby & Me book's Breastfeeding Basics.  Mom knows to call Nurse or [place] if she has amy questions, concerns or if she needs assistance latching infant to breast.  Mom made aware of O/P services, breastfeeding support groups, community resources, and our phone # for post-discharge questions.    Maternal Data Formula Feeding for Exclusion: No Has patient been taught Hand Expression?: Yes Does the patient have breastfeeding experience prior to this delivery?: No  Feeding Feeding Type: Breast Fed  LATCH Score Latch: Repeated attempts needed to sustain latch, nipple held in mouth throughout feeding, stimulation needed to elicit sucking reflex.  Audible Swallowing: A few with stimulation  Type of Nipple: Everted at rest and after stimulation  Comfort (Breast/Nipple): Soft / non-tender  Hold (Positioning): No assistance needed to correctly position infant at breast.  LATCH Score: 8  Interventions Interventions: Breast feeding basics reviewed;Skin to skin;Position options;Hand pump  Lactation Tools Discussed/Used WIC Program: Yes Pump Review: Setup, frequency, and cleaning;Milk Storage Initiated by:: Vicente Serene, IBCLC Date initiated:: 09/26/18   Consult Status Consult Status: Follow-up    Vicente Serene 09/26/2018, 6:05  AM

## 2018-09-26 NOTE — Lactation Note (Signed)
This note was copied from a baby's chart. Lactation Consultation Note  Patient Name: Cynthia Smith [id number]'[id number] Date: 09/26/2018 Reason for consult: Follow-up assessment Mom in bed.  Dad face timing holding infant.  Infant awake but content. Baby Cynthia now 28 hours old.   Mom asked about different ways to position infant. Mom asked if there were other positions besides football hold. Asked if I could get infant and show her.  Mom reports That it hasn't been long since infant ate and if I could show her without infant.  Demo cradle and cross cradle position with no infant.  Mom reports she understands.  Urged mom to feed on cue and 8 or more times day.  Mom post csection and limited prenatal care. Urged mom to call lactation as needed.  Maternal Data    Feeding    LATCH Score                   Interventions Interventions: Breast feeding basics reviewed  Lactation Tools Discussed/Used     Consult Status Consult Status: Follow-up Date: 09/27/18 Follow-up type: [place] 09/26/2018, 9:10 PM

## 2018-09-27 DIAGNOSIS — Z30017 Encounter for initial prescription of implantable subdermal contraceptive: Secondary | ICD-10-CM

## 2018-09-27 MED ORDER — LIDOCAINE HCL 1 % IJ SOLN
0.0000 mL | Freq: Once | INTRAMUSCULAR | Status: DC | PRN
  Filled 2018-09-27: qty 20

## 2018-09-27 MED ORDER — MEASLES, MUMPS & RUBELLA VAC IJ SOLR
0.5000 mL | Freq: Once | INTRAMUSCULAR | Status: AC
  Administered 2018-09-29: 0.5 mL via SUBCUTANEOUS
  Filled 2018-09-27: qty 0.5

## 2018-09-27 MED ORDER — ETONOGESTREL 68 MG ~~LOC~~ IMPL
68.0000 mg | DRUG_IMPLANT | Freq: Once | SUBCUTANEOUS | Status: AC
  Administered 2018-09-27: 12:00:00 68 mg via SUBCUTANEOUS
  Filled 2018-09-27: qty 1

## 2018-09-27 MED ORDER — FERROUS FUMARATE 324 (106 FE) MG PO TABS
1.0000 | ORAL_TABLET | Freq: Two times a day (BID) | ORAL | Status: DC
  Administered 2018-09-28 – 2018-09-29 (×3): 106 mg via ORAL
  Filled 2018-09-27 (×3): qty 1

## 2018-09-27 NOTE — Progress Notes (Signed)
CSW received consult for late PNC at 34.6 wks.  CSW reviewed chart and is screening out consult as it does not meet criteria for automatic CSW involvement and infant drug screening.  MOB started care prior to 28 weeks and had more than 3 visits.  Please contact CSW if current concerns arise or by MOB's request.  Elysabeth Aust, LCSWA  [facility] [phone]  

## 2018-09-27 NOTE — Lactation Note (Signed)
This note was copied from a baby's chart. Lactation Consultation Note  Patient Name: Cynthia Smith [id number] Date: 09/27/2018 Reason for consult: Follow-up assessment Baby is 38 hours/4% weight loss.  Mom is sitting on the side of the bed attempting to latch baby.  Recommended she get positioned in the bed for back support.  Assisted with positioning baby in cross cradle hold on left breast.  Hand expression done prior to attempt and large drop of colostrum expressed.  Baby latched easily and well.  Observed first 10 minutes of feeding and swallows identified.  Instructed on waking techniques and breast massage.  Reviewed basics and answered questions.  Encouraged to call for assist prn.  Maternal Data    Feeding Feeding Type: Breast Fed  LATCH Score Latch: Grasps breast easily, tongue down, lips flanged, rhythmical sucking.  Audible Swallowing: Spontaneous and intermittent  Type of Nipple: Everted at rest and after stimulation  Comfort (Breast/Nipple): Soft / non-tender  Hold (Positioning): Assistance needed to correctly position infant at breast and maintain latch.  LATCH Score: 9  Interventions    Lactation Tools Discussed/Used     Consult Status Consult Status: Follow-up Date: 09/28/18 Follow-up type: In-patient    Ave Filter 09/27/2018, 3:03 PM

## 2018-09-27 NOTE — Procedures (Signed)
Post-Placental Nexplanon Insertion Procedure Note  Patient was identified. Informed consent was signed, signed copy in chart. A time-out was performed.    The insertion site was identified 8-10 cm (3-4 inches) from the medial epicondyle of the humerus and 3-5 cm (1.25-2 inches) posterior to (below) the sulcus (groove) between the biceps and triceps muscles of the patient's left arm and marked. The site was prepped and draped in the usual sterile fashion. Pt was prepped with alcohol swab and then injected with 3 cc of 1% lidocaine. The site was prepped with betadine. Nexplanon removed form packaging,  Device confirmed in needle, then inserted full length of needle and withdrawn per handbook instructions. Provider and patient verified presence of the implant in the woman's arm by palpation. Pt insertion site was covered with steristrips/adhesive bandage and pressure bandage. There was minimal blood loss. Patient tolerated procedure well.  Patient was given post procedure instructions and Nexplanon user card with expiration date. Condoms were recommended for STI prevention. Patient was asked to keep the pressure dressing on for 24 hours to minimize bruising and keep the adhesive bandage on for 3-5 days. The patient verbalized understanding of the plan of care and agrees.    

## 2018-09-27 NOTE — Progress Notes (Addendum)
Post Partum Day 1/ POD #1 C/S Subjective: G1P1001 No complaints, up ad lib, voiding and tolerating PO, small lochia, plans to breastfeed, Nexplanon  Objective: Blood pressure (!) 108/59, pulse 87, temperature 98.2 F (36.8 C), temperature source Oral, resp. rate 18, height 5\' 1"  (1.549 m), weight 70.3 kg, last menstrual period 12/04/2017, SpO2 98 %, unknown if currently breastfeeding.  Physical Exam:  General: alert, cooperative and no distress Lochia:normal flow CV: normal peripheral perfusion Abdomen: +BS, soft, nontender,  Uterine Fundus: firm Skin: Surgical incision c/d/i DVT Evaluation: No evidence of DVT seen on physical exam. Extremities: trace edema  Recent Labs    09/26/18 0823  HGB 8.8*  HCT 26.5*    Assessment/Plan: Plan for discharge tomorrow, Breastfeeding and Contraception nexplanon placed on 09/27/2018   POD #1 Primary C/S: continue ambulation, prn meds for pain/gas control, +flatus/+voids -possible dc in 24 hours   LOS: 3 days   Nikki Dom Driver 01/25/1759, 60:73 PM

## 2018-09-28 MED ORDER — SENNOSIDES-DOCUSATE SODIUM 8.6-50 MG PO TABS: 2.0000 | ORAL_TABLET | ORAL | Status: AC

## 2018-09-28 MED ORDER — FERROUS FUMARATE 324 (106 FE) MG PO TABS: 1.0000 | ORAL_TABLET | Freq: Two times a day (BID) | ORAL | 0 refills | Status: AC

## 2018-09-28 MED ORDER — IBUPROFEN 800 MG PO TABS: 800.0000 mg | ORAL_TABLET | Freq: Four times a day (QID) | ORAL | 0 refills | Status: AC

## 2018-09-28 MED ORDER — PRENATAL MULTIVITAMIN CH: 1.0000 | ORAL_TABLET | Freq: Every day | ORAL | 0 refills | Status: AC

## 2018-09-28 MED ORDER — INFLUENZA VAC SPLIT QUAD 0.5 ML IM SUSY
0.5000 mL | PREFILLED_SYRINGE | INTRAMUSCULAR | Status: AC
  Administered 2018-09-29: 0.5 mL via INTRAMUSCULAR
  Filled 2018-09-28 (×2): qty 0.5

## 2018-09-28 NOTE — Progress Notes (Signed)
Cynthia Smith [id number] Postpartum Day 2 S/P Primary Cesarean Section due to Arrest of Dilation, Failed PD IOL.  Subjective: Patient up ad lib, denies syncope or dizziness. Reports consuming regular diet without issues and denies N/V. Patient does report some chest pain that is intermittent and lasts 30-89min when it occurs. Patient reports the pain is a "heavy" feeling and resolves without interventions.   Patient denies a history of anxiety and states pain is not manifested after eating.  Patient goes on to state that the pain has been present throughout her pregnancy and she has not experienced it recently.   Patient reports that she has not had a bowel movement, but is passing flatus.  Denies issues with urination and reports bleeding is "lightening up."  Patient is breast and bottlefeeding and reports going well.  Postpartum contraception, Nexplanon, placed.  Pain is being appropriately managed with use of Motrin. Patient expresses desire for discharge today.   Objective: Temp:  [98.2 F (36.8 C)-98.7 F (37.1 C)] 98.2 F (36.8 C) (09/08 0520) Pulse Rate:  [66-68] 66 (09/08 0520) Resp:  [17-18] 18 (09/08 0520) BP: (113-114)/(67-72) 113/72 (09/08 0520) SpO2:  [100 %] 100 % (09/08 0520)  Recent Labs    09/26/18 0823  HGB 8.8*  HCT 26.5*  WBC 16.0*    Physical Exam:  General: alert, cooperative and no distress Mood/Affect: Appropriate/Congruent Lungs: clear to auscultation, no wheezes, rales or rhonchi, symmetric air entry.  Heart: normal rate and regular rhythm. Extremities: Left arm; Nexplanon palpated.  Some bruising.  Steri-strip in place.  Breast: not examined. Abdomen:  + bowel sounds, Soft, Appropriately Tender, No Distention, BSx4Q.  Incision: No significant drainage on Honeycomb dressing  Uterine Fundus: firm at U/-1 Lochia: appropriate Skin: Warm, Dry. DVT Evaluation: No evidence of DVT seen on physical exam. Calf/Ankle edema is present. JP drain:    None  Assessment Post Operative Day 2 S/P Primary C/S Normal Involution Breat/Bottle Feeding Anemia Hemodynamically Stable  Plan: -Instructed to report chest pain if it occurs in hospital setting.  Informed that it could range from anxiety to heartburn, but further assessment would be necessary.  -Informed of iron supplementation for anemia. -Discussed possible discharge tonight if infant is eligible. If not will plan for discharge tomorrow. -Infant for outpt circumcision.  -Continue other mgmt as ordered.  Maryann Conners MSN, CNM 09/28/2018, 12:47 PM

## 2018-09-28 NOTE — Lactation Note (Signed)
This note was copied from a baby's chart. Lactation Consultation Note  Patient Name: Cynthia Smith [id number]'W Date: 09/28/2018 Reason for consult: Follow-up assessment;Infant weight loss Baby is 60 hours old/8% weight loss.  Baby cluster fed all night and is acting frantic hungry now.  Parents gave baby a pacifier.  Discussed baby is hungry and needs to eat.  Baby has had 5 stools but 0 voids in past 24 hours.  Set up symphony pump and initiated pumping to obtain supplement.  Mom pumped a total of 10 mls.  Baby took expressed milk quickly but still acted hungry.  Parents would like to give formula to increase voids.  20 mls of gerber goodstart prepared and FOB gave by bottle.  Mom is exhausted.  Encouraged to nap now since baby should be calmer.  Instructed to continue to post pump every 3 hours.  Mom will be discharged with a manual pump.  Maternal Data    Feeding Feeding Type: Breast Milk  LATCH Score                   Interventions    Lactation Tools Discussed/Used WIC Program: Yes Pump Review: Setup, frequency, and cleaning;Milk Storage Initiated by:: [email or web address] Date initiated:: 09/28/18   Consult Status Consult Status: Follow-up Date: 09/29/18 Follow-up type: In-patient    Ave Filter 09/28/2018, 12:55 PM

## 2018-09-29 MED ORDER — HYDROCODONE-ACETAMINOPHEN 5-325 MG PO TABS: 2.0000 | ORAL_TABLET | Freq: Four times a day (QID) | ORAL | 0 refills | Status: AC | PRN

## 2018-09-29 NOTE — Progress Notes (Addendum)
Subjective: Postpartum Day 3: Cesarean Delivery  Patient's incision site pain is controlled. She complains of headache that occurred evening of 09/08 that responded to tylenol and ibuprofen. Her headache was w/o visual changes or changes in blood pressure. She continues to endorse abdominal and uterine cramps. Her lochia has lightened since 09/08. She denies occurrences of clotting. Breast and bottle feeding her baby without complication. She received her nexplanon implant yesterday in her left arm.   She is tolerating a PO diet, ambulating well, voiding, and having regular bowel movements. She denies visual changes, shortness of breath, chest pain or abdominal pain aside from incisional site pain.    Her baby has been cleared. Patient is comfortable being discharged this evening.   Objective: Vital signs in last 24 hours: Temp:  [98.1 F (36.7 C)-98.9 F (37.2 C)] 98.7 F (37.1 C) (09/09 0520) Pulse Rate:  [57-80] 57 (09/09 0520) Resp:  [16-17] 16 (09/09 0520) BP: (115-125)/(70-83) 125/83 (09/09 0520) SpO2:  [100 %] 100 % (09/08 2155)  Physical Exam:  General: alert, cooperative, appears stated age and no distress  Cardio: RRR, S1, S2, no M/R/G Pulm: CTAB, no wheezes or rales Abd: Soft abdomen, NABS, no tenderness to palpation  Lochia: appropriate, lightened compared to 09/08 Uterine Fundus: firm Incision: healing well, no significant drainage through honeycomb dressing  DVT Evaluation: No evidence of DVT seen on physical exam. No significant calf/ankle edema.  No results for input(s): HGB, HCT in the last 72 hours.  Assessment/Plan: Status post Cesarean section. Doing well postoperatively.  - Discharge home with standard precautions and return to clinic in 4-6 weeks. - Prior to discharge, patient needs: MMR vaccination  Poonam Patel 09/29/2018, 10:49 AM    I have seen and evaluated the patient with the NP/PA/Med student. I agree with the assessment and plan as written above.  See provider note for full documentation Please see discharge summary for full documentation.    Jorje Guild, NP  09/29/2018 11:28 AM

## 2018-09-29 NOTE — Discharge Instructions (Signed)

## 2018-09-29 NOTE — Discharge Summary (Addendum)
Postpartum Discharge Summary     Patient Name: Cynthia Smith DOB: May 15, 1995 MRN: [id number]  Date of admission: 09/24/2018 Delivering Provider: Aletha Halim   Date of discharge: 09/29/2018  Admitting diagnosis: pregnancy Intrauterine pregnancy: [redacted]w[redacted]d    Secondary diagnosis:  Active Problems:   Post-dates pregnancy  Additional problems:  Primary Cesarean Section Failed induction of labor Arrest of dilation     Discharge diagnosis: Term Pregnancy Delivered                                                                                                Post partum procedures:n/a  Augmentation: AROM, Pitocin, Cytotec and Foley Balloon  Complications: None  Hospital course:  Induction of Labor With Cesarean Section  23y.o. yo G1P0 at 491w2das admitted to the hospital 09/24/2018 for induction of labor. Patient had a labor course significant for Cytotec x2, foley balloon placement, AROM and pitocin augmentation. Despite prolonged period of pitocin augmentation patient failed to dilate beyond 8 cm and was recommended for cesarean. The patient went for cesarean section due to Arrest of Dilation, and delivered a Viable infant,09/26/2018  Membrane Rupture Time/Date: 2:16 AM ,09/25/2018   Details of operation can be found in separate operative Note.  Patient had an uncomplicated postpartum course. Had some retrosternal, pleuritic chest pain. 2/10 severity. No radiation. Not reproducible on palpation. No tachycardia or hypoxia-vital signs remained normal. She is ambulating, tolerating a regular diet, passing flatus, and urinating well.  Patient is discharged home in stable condition on 09/29/18.                                   Delivery time: 12:18 AM    Magnesium Sulfate received: No BMZ received: No Rhophylac:N/A MMR:Yes Transfusion:No  Physical exam  Vitals:   09/28/18 0520 09/28/18 1435 09/28/18 2155 09/29/18 0520  BP: 113/72 124/70 115/79 125/83  Pulse: 66 80 62 (!) 57   Resp: _0 Temp: 98.2 F (36.8 C) 98.1 F (36.7 C) 98.9 F (37.2 C) 98.7 F (37.1 C)  TempSrc: Oral Oral Oral Oral  SpO2: 100%  100%   Weight:      Height:       General: alert, cooperative and no distress  Cardiac: RRR, no rubs or gallops  Pulm: chest clear on ausc, no crackles or wheeze, no respiratory distress GI: Abdo soft, mildly tender in lower abdomen. Bowel sounds present  Lochia: appropriate Uterine Fundus: firm Incision: Dressing is clean, dry, and intact DVT Evaluation: No cords or calf tenderness. No significant calf/ankle edema.  Labs: Lab Results  Component Value Date   WBC 16.0 (H) 09/26/2018   HGB 8.8 (L) 09/26/2018   HCT 26.5 (L) 09/26/2018   MCV 89.8 09/26/2018   PLT 155 09/26/2018   No flowsheet data found.  Discharge instruction: per After Visit Summary and "Baby and Me Booklet".  After visit meds:  Allergies as of 09/29/2018   No Known Allergies     Medication List    STOP taking  these medications   acetaminophen 500 MG tablet Commonly known as: TYLENOL   cephALEXin 500 MG capsule Commonly known as: KEFLEX   ferrous sulfate 325 (65 FE) MG tablet Commonly known as: FerrouSul   magnesium citrate Soln   PNV PO     TAKE these medications   Ferrous Fumarate 324 (106 Fe) MG Tabs tablet Commonly known as: HEMOCYTE - 106 mg FE Take 1 tablet (106 mg of iron total) by mouth 2 (two) times daily.   ibuprofen 800 MG tablet Commonly known as: ADVIL Take 1 tablet (800 mg total) by mouth every 6 (six) hours.   prenatal multivitamin Tabs tablet Take 1 tablet by mouth daily at 12 noon.   senna-docusate 8.6-50 MG tablet Commonly known as: Senokot-S Take 2 tablets by mouth daily.       Diet: routine diet  Activity: Advance as tolerated. Pelvic rest for 6 weeks.   Outpatient follow up:2 weeks Follow up Appt: Future Appointments  Date Time Provider [place]  10/12/2018 10:20 AM Sombrillo Grayling  11/01/2018   9:35 AM Dione Plover, Annice Needy, MD [facility]   Follow up Visit:  Please schedule this patient for Postpartum visit in: 2 weeks with the following provider: MD For C/S patients schedule nurse incision check in weeks 2 weeks: yes Low risk pregnancy complicated by: n/a Delivery mode:  CS Anticipated Birth Control:  Nexplanon - placed prior to discharge PP Procedures needed: Incision check  Schedule Integrated BH visit: no    Newborn Data: Live born female  Birth Weight: 3695g  APGAR: 4, 9  Newborn Delivery   Birth date/time: 09/26/2018 00:18:00 Delivery type: C-Section, Low Transverse Trial of labor: Yes C-section categorization: Primary      Baby Feeding: Bottle and Breast Disposition:home with mother   09/29/2018 Lattie Haw, MD    Provider attestation I have seen and examined this patient and agree with above documentation in the resident's note.   Cynthia Smith is a 23 y.o. G1P1001 s/p primary c/section.  Pain is well controlled. Plan for birth control is nexplanon placed prior to discharge. Method of Feeding: breast & bottle  PE:  Gen: well appearing Heart: reg rate Lungs: normal WOB Fundus firm Ext: no pain, no edema  No results for input(s): HGB, HCT in the last 72 hours.  Assessment S/p c/section PPD # 3  Plan: - discharge home - postpartum care discussed - f/u in office in 6 weeks for postpartum visit -incision check in 2 weeks -nexplanon placed prior to discharge -MMR ordered   Jorje Guild, NP 11:45 AM

## 2018-09-29 NOTE — Lactation Note (Signed)
This note was copied from a baby's chart. Lactation Consultation Note  Patient Name: Cynthia Smith [id number] Date: 09/29/2018 Reason for consult: Follow-up assessment  P1 mother whose infant is now 5 hours old.  Baby was asleep on father's chest when I arrived.  Mother had no further questions/concerns related to breast feeding.    Engorgement prevention/treatment discussed.  Mother has a manual pump at bedside.  She does not have a DEBP but will contact [facility] to obtain a pump after discharge if she desires.    Mother is awaiting discharge.     Maternal Data    Feeding Feeding Type: Breast Fed  LATCH Score Latch: Grasps breast easily, tongue down, lips flanged, rhythmical sucking.  Audible Swallowing: Spontaneous and intermittent  Type of Nipple: Everted at rest and after stimulation  Comfort (Breast/Nipple): Filling, red/small blisters or bruises, mild/mod discomfort  Hold (Positioning): Assistance needed to correctly position infant at breast and maintain latch.  LATCH Score: 8  Interventions Interventions: Breast feeding basics reviewed;Assisted with latch;Skin to skin;Hand express;Breast massage;Adjust position;Support pillows;Expressed milk  Lactation Tools Discussed/Used     Consult Status Consult Status: Complete Date: 09/29/18 Follow-up type: Call as needed    Cynthia Smith 09/29/2018, 11:09 AM

## 2018-10-12 ENCOUNTER — Ambulatory Visit (INDEPENDENT_AMBULATORY_CARE_PROVIDER_SITE_OTHER): Payer: Self-pay

## 2018-10-12 ENCOUNTER — Other Ambulatory Visit: Payer: Self-pay

## 2018-10-12 VITALS — BP 105/70 | HR 64 | Wt 135.8 lb

## 2018-10-12 DIAGNOSIS — Z5189 Encounter for other specified aftercare: Secondary | ICD-10-CM

## 2018-10-12 NOTE — Progress Notes (Signed)
Pt here today for incision check following c-section on 09/26/18. Incision approximated, no redness, drainage, or edema present. Pt reports no pain or discomfort. Steri strips removed. Pt educated to continue cleaning area with warm, soapy water. Signs and symptoms of infection reviewed with patient. Pt will follow up at [facility] visit on 11/01/18.  Apolonio Schneiders, RN 10/12/18

## 2018-10-12 NOTE — Progress Notes (Signed)
Chart reviewed for nurse visit. Agree with plan of care.   Jorje Guild, NP 10/12/2018 12:24 PM

## 2018-11-01 ENCOUNTER — Ambulatory Visit (INDEPENDENT_AMBULATORY_CARE_PROVIDER_SITE_OTHER): Payer: Self-pay | Admitting: Family Medicine

## 2018-11-01 ENCOUNTER — Encounter: Payer: Self-pay | Admitting: Family Medicine

## 2018-11-01 ENCOUNTER — Other Ambulatory Visit: Payer: Self-pay

## 2018-11-01 VITALS — BP 121/79 | HR 79 | Wt 131.0 lb

## 2018-11-01 DIAGNOSIS — K5900 Constipation, unspecified: Secondary | ICD-10-CM

## 2018-11-01 DIAGNOSIS — O09899 Supervision of other high risk pregnancies, unspecified trimester: Secondary | ICD-10-CM

## 2018-11-01 DIAGNOSIS — O0990 Supervision of high risk pregnancy, unspecified, unspecified trimester: Secondary | ICD-10-CM

## 2018-11-01 DIAGNOSIS — R87610 Atypical squamous cells of undetermined significance on cytologic smear of cervix (ASC-US): Secondary | ICD-10-CM | POA: Insufficient documentation

## 2018-11-01 DIAGNOSIS — Z283 Underimmunization status: Secondary | ICD-10-CM

## 2018-11-01 MED ORDER — POLYETHYLENE GLYCOL 3350 17 GM/SCOOP PO POWD: ORAL | 1 refills | Status: AC

## 2018-11-01 NOTE — Progress Notes (Signed)
Subjective:     Cynthia Smith is a 23 y.o. female who presents for a postpartum visit. She is 6 weeks postpartum following a Primary Low transverse cesarean section. I have fully reviewed the prenatal and intrapartum course. The delivery was at 72 gestational weeks. Outcome: primary cesarean section, low transverse incision. Anesthesia: spinal. Postpartum course has been unremarkable. Baby's course has been unremarkable. Baby is feeding by breast. Bleeding staining only. Bowel function is abnormal: constipation. Bladder function is normal. Patient is not sexually active. Contraception method is Nexplanon. Postpartum depression screening: Negative.  Review of Systems Pertinent items noted in HPI and remainder of comprehensive ROS otherwise negative.   Objective:    BP 121/79   Pulse 79   Wt 131 lb (59.4 kg)   BMI 24.75 kg/m   General:  NAD   Breasts:  deferred  Lungs: Comfortable on room air  Abdomen:  MSK: Soft, nontender to palpation, incision healed without erythema, edema or drainage  Nexplanon palpated in correct position in L arm  GU: Psych:  deferred  normal mood and affect        Assessment:     Normal postpartum exam. Pap smear not done at today's visit.   Plan:    1. Contraception: Nexplanon 2. Constipation: rx sent for miralax 3. ASCUS pap: prenatal records from [place] reviewed, had ASCUS pap without comment on HPV in 01/2018, needs repeat 01/2019 sent to front desk to schedule at check out 4. Mood normal, some baby blues after delivery but now feeling well and bonding with baby 5. Rubella non-immune: received MMR during PP stay 6. Follow up in: 3 month for repeat pap smear

## 2018-11-01 NOTE — Patient Instructions (Signed)

## 2019-02-03 ENCOUNTER — Encounter (HOSPITAL_COMMUNITY): Payer: Self-pay | Admitting: Obstetrics and Gynecology

## 2019-02-04 ENCOUNTER — Encounter (HOSPITAL_COMMUNITY): Payer: Self-pay | Admitting: Obstetrics and Gynecology

## 2019-02-21 ENCOUNTER — Ambulatory Visit: Payer: Self-pay | Admitting: Obstetrics and Gynecology

## 2019-10-03 IMAGING — US US FETAL BPP W/ NON-STRESS
1 series · 13 of 14 positions shown · non-contrast
Comparison: none

[Series 1: us fetal bpp w/nonstress · 14 acquisitions, 13 frames shown]
[im 1/14]
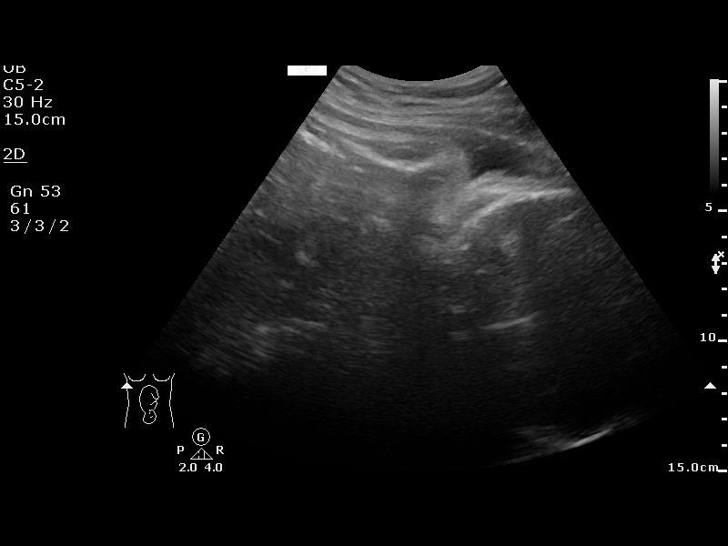
[im 2/14]
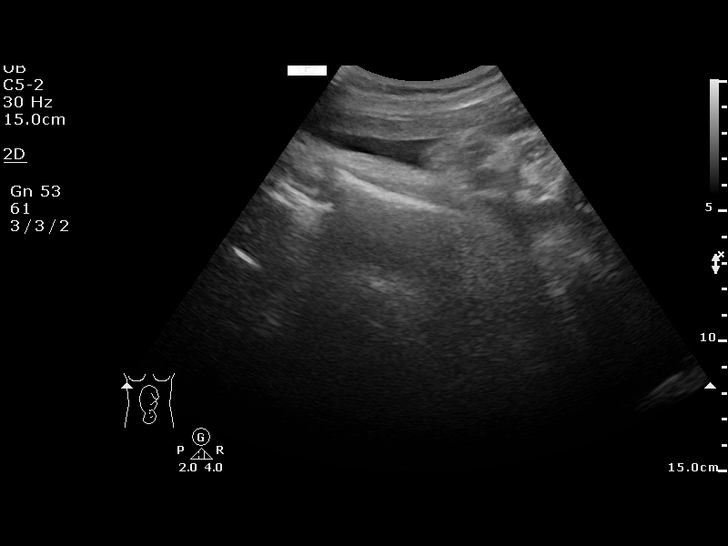
[im 3/14]
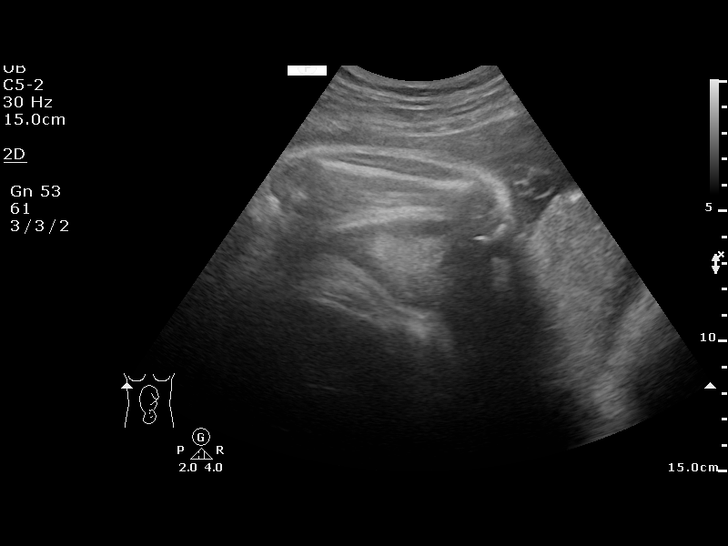
[im 4/14]
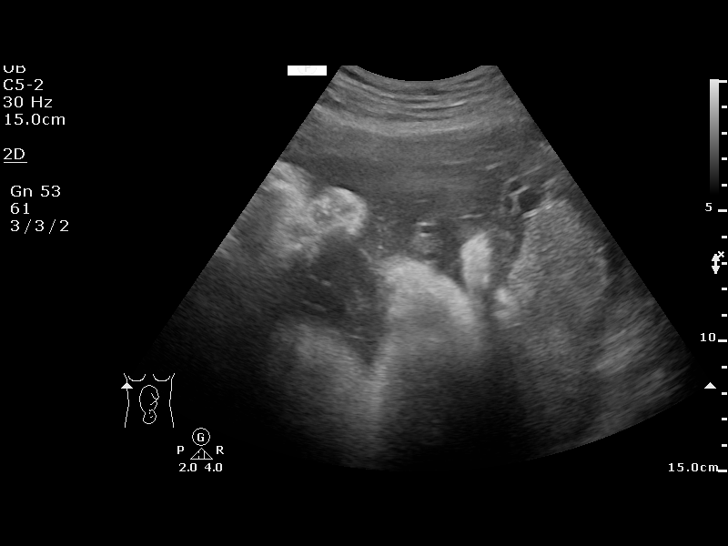
[im 5/14]
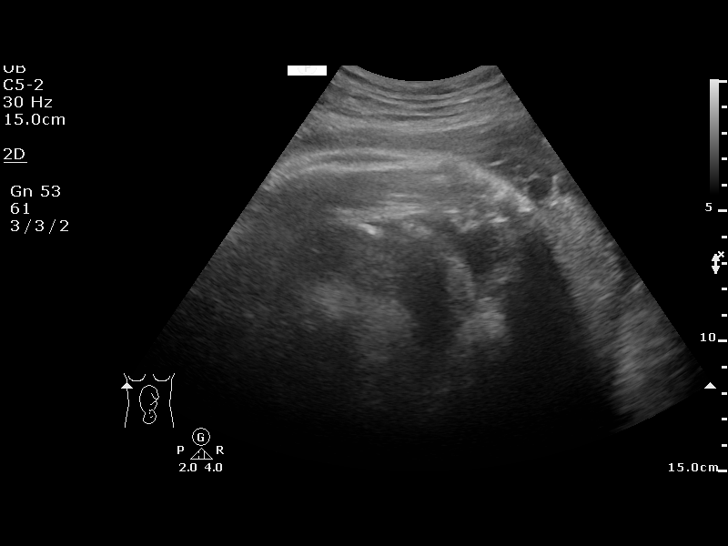
[im 6/14]
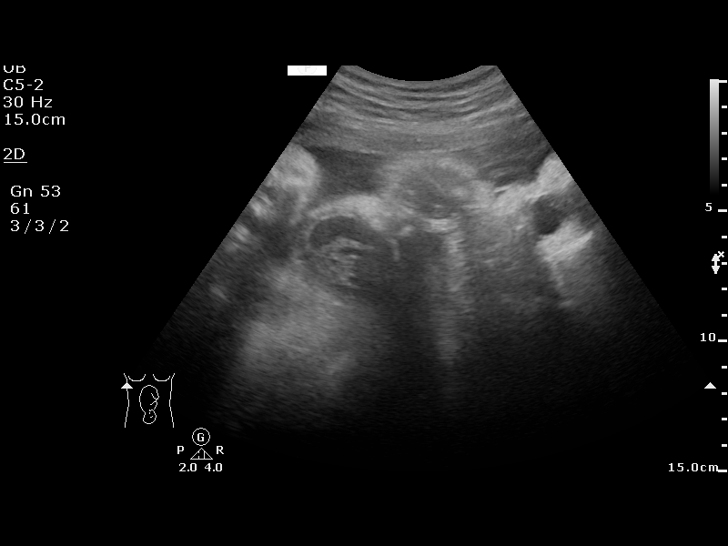
[im 8/14]
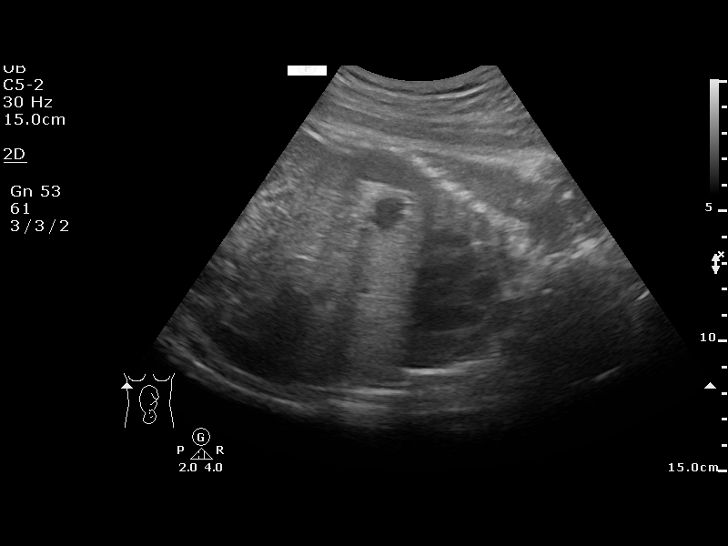
[im 9/14]
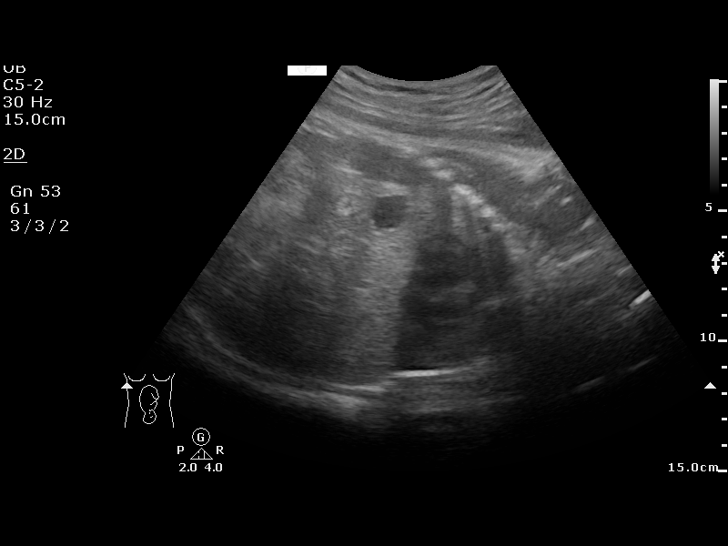
[im 10/14]
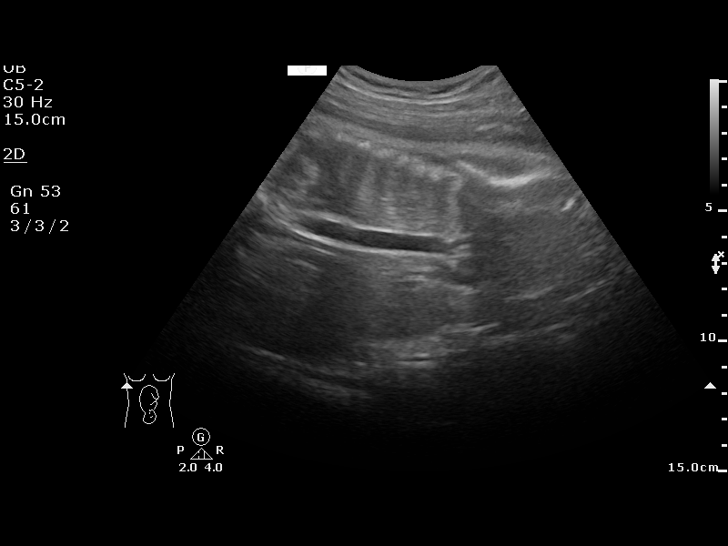
[im 11/14]
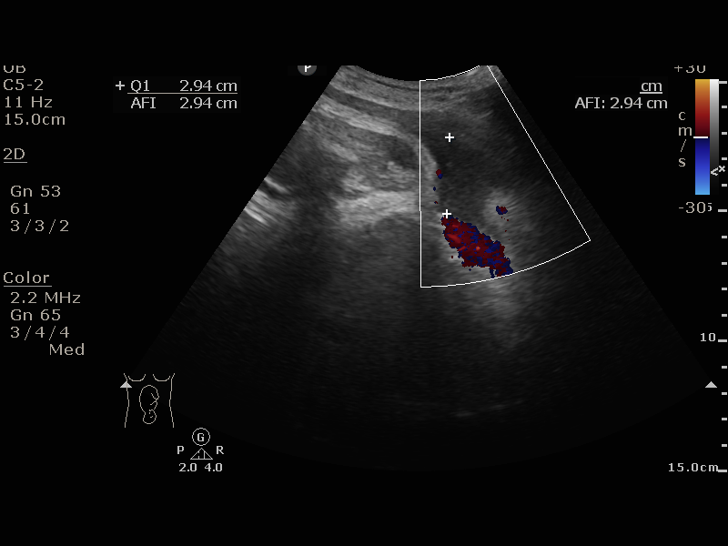
[im 12/14]
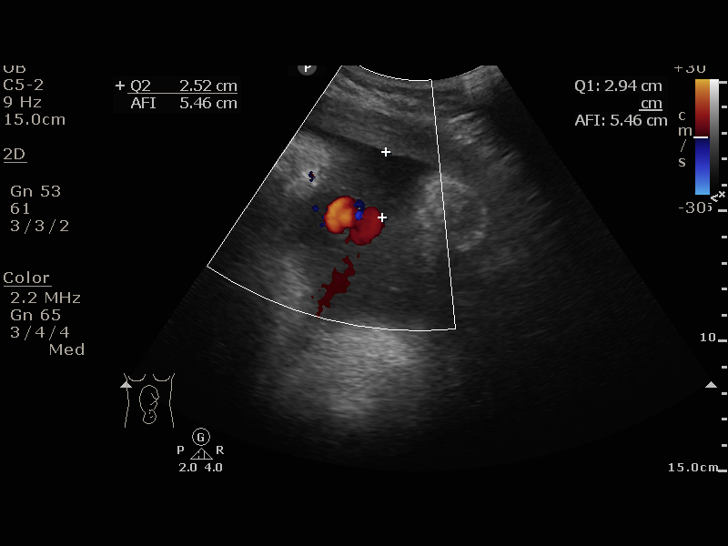
[im 13/14]
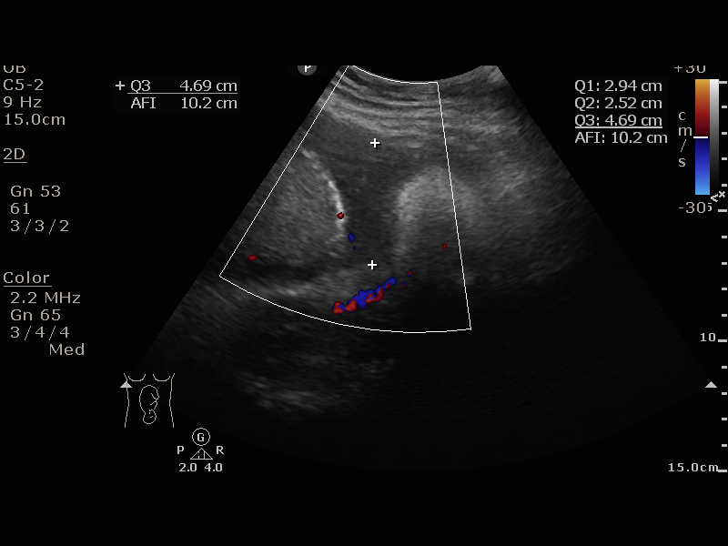
[im 14/14]
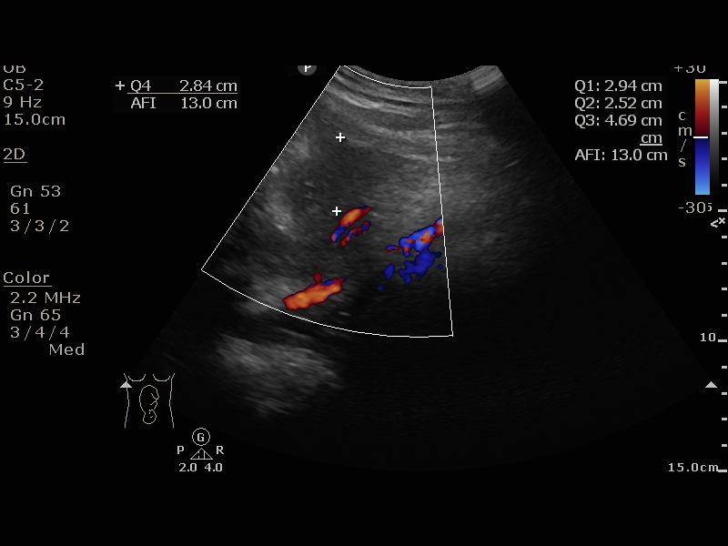

[13 of 14 positions shown; findings below may reference images not displayed]

[address]
                                                            Women's
                                                            [REDACTED]

  1  US FETAL BPP W/NONSTRESS             76818.4      ROVSHANA BASOGLU
 ----------------------------------------------------------------------

 ----------------------------------------------------------------------
Service(s) Provided

 ----------------------------------------------------------------------
Indications

  41 weeks gestation of pregnancy
  Postdate pregnancy (40-42 weeks)
 ----------------------------------------------------------------------
Fetal Evaluation

 Num Of Fetuses:         1
 Preg. Location:         Intrauterine
 Cardiac Activity:       Observed
 Presentation:           Cephalic

 Amniotic Fluid
 AFI FV:      Within normal limits

 AFI Sum(cm)     %Tile       Largest Pocket(cm)
 12.99           58

 RUQ(cm)       RLQ(cm)       LUQ(cm)        LLQ(cm)

Biophysical Evaluation

 Amniotic F.V:   Pocket => 2 cm two         F. Tone:        Observed
                 planes
 F. Movement:    Observed                   N.S.T:          Reactive
 F. Breathing:   Observed                   Score:          [DATE]
OB History
 Gravidity:    1         Term:   0        Prem:   0        SAB:   0
 TOP:          0       Ectopic:  0        Living: 0
Gestational Age

 LMP:           41w 0d        Date:  12/07/17                 EDD:   09/13/18
 Best:          41w 0d     Det. By:  LMP  (12/07/17)          EDD:   09/13/18
Impression

 Reassuring antenatal testing
Recommendations

 Continue testing as needed
                Germer, Hoang Nguyen
